# Patient Record
Sex: Male | Born: 1960 | Race: Black or African American | Hispanic: No | Marital: Single | Smoking: Never smoker
Health system: Southern US, Community
[De-identification: ages and names within clinical notes are randomized; demographics above are authoritative.]

## PROBLEM LIST (undated history)

## (undated) DIAGNOSIS — C9110 Chronic lymphocytic leukemia of B-cell type not having achieved remission: Secondary | ICD-10-CM

## (undated) DIAGNOSIS — I10 Essential (primary) hypertension: Secondary | ICD-10-CM

---

## 2011-07-03 ENCOUNTER — Emergency Department (HOSPITAL_COMMUNITY): Payer: BC Managed Care – PPO

## 2011-07-03 ENCOUNTER — Encounter: Payer: Self-pay | Admitting: *Deleted

## 2011-07-03 ENCOUNTER — Emergency Department (HOSPITAL_COMMUNITY)
Admission: EM | Admit: 2011-07-03 | Discharge: 2011-07-03 | Disposition: A | Discharge status: Home / Self Care | Payer: BC Managed Care – PPO | Attending: Emergency Medicine | Admitting: Emergency Medicine

## 2011-07-03 DIAGNOSIS — I498 Other specified cardiac arrhythmias: Secondary | ICD-10-CM | POA: Insufficient documentation

## 2011-07-03 DIAGNOSIS — R531 Weakness: Secondary | ICD-10-CM

## 2011-07-03 DIAGNOSIS — R5383 Other fatigue: Secondary | ICD-10-CM | POA: Insufficient documentation

## 2011-07-03 DIAGNOSIS — R5381 Other malaise: Secondary | ICD-10-CM | POA: Insufficient documentation

## 2011-07-03 HISTORY — DX: Essential (primary) hypertension: I10

## 2011-07-03 LAB — DIFFERENTIAL
Basophils Relative: 0 % (ref 0–1)
Eosinophils Relative: 1 % (ref 0–5)
Lymphs Abs: 13.9 10*3/uL — ABNORMAL HIGH (ref 0.7–4.0)
Monocytes Relative: 4 % (ref 3–12)
Neutro Abs: 10.6 10*3/uL — ABNORMAL HIGH (ref 1.7–7.7)

## 2011-07-03 LAB — COMPREHENSIVE METABOLIC PANEL
ALT: 9 U/L (ref 0–53)
Alkaline Phosphatase: 67 U/L (ref 39–117)
BUN: 17 mg/dL (ref 6–23)
CO2: 25 mEq/L (ref 19–32)
Chloride: 102 mEq/L (ref 96–112)
GFR calc Af Amer: >90 mL/min (ref >90)
Glucose, Bld: 156 mg/dL — ABNORMAL HIGH (ref 70–99)
Potassium: 3.6 mEq/L (ref 3.5–5.1)
Total Bilirubin: 0.3 mg/dL (ref 0.3–1.2)

## 2011-07-03 LAB — POCT I-STAT TROPONIN I: Troponin i, poc: 0.01 ng/mL (ref 0.00–0.08)

## 2011-07-03 LAB — CBC
HCT: 45.7 % (ref 39.0–52.0)
Hemoglobin: 15.3 g/dL (ref 13.0–17.0)
MCV: 93.8 fL (ref 78.0–100.0)
RBC: 4.87 MIL/uL (ref 4.22–5.81)
RDW: 14.7 % (ref 11.5–15.5)
WBC: 25.8 10*3/uL — ABNORMAL HIGH (ref 4.0–10.5)

## 2011-07-03 MED ORDER — SODIUM CHLORIDE 0.9 % IV BOLUS (SEPSIS)
1000.0000 mL | Freq: Once | INTRAVENOUS | Status: AC
  Administered 2011-07-03 (×2): 1000 mL via INTRAVENOUS

## 2011-07-03 MED ORDER — ASPIRIN 81 MG PO CHEW
324.0000 mg | CHEWABLE_TABLET | Freq: Once | ORAL | Status: AC
  Administered 2011-07-03: 324 mg via ORAL
  Filled 2011-07-03: qty 4

## 2011-07-03 NOTE — ED Notes (Signed)
Reports weakness and diaphoresis this am after waking up and getting to work. SB on monitor, 48-55. EKG shown to Dr. Alto Denver. Alert and oriented.

## 2011-07-03 NOTE — ED Notes (Signed)
Pt c/o weakness, diaphoresis, shaking at 0500 this morning. Pt had just urinated when sudden onset of s/s. Pt states diaphoresis resolved x 30 mins ago. Pt continues to c/o weakness and shakiness. Pt is bradycardiac in triage.

## 2011-07-03 NOTE — ED Provider Notes (Signed)
History     CSN: [id number]  Arrival date & time 07/03/11  0620   First MD Initiated Contact with Patient 07/03/11 [id number]      Chief Complaint  Patient presents with  . Weakness  . Shaking  . Excessive Sweating    (Consider location/radiation/quality/duration/timing/severity/associated sxs/prior treatment) HPI Patient is a 51 year old male who presents today complaining of generalized weakness. He noted this upon waking. He was worried that this could be a problem with his heart. He has no history of coronary artery disease and she denies any chest pain or shortness of breath. He endorses some shakiness as well. He was sick about 2 weeks ago with upper respiratory infection symptoms but these have resolved. He denies any cough or fevers. The patient denies any blood in his stools or dark tarry stools. He does report that in the past he has had low blood counts but he doesn't know what the reason for this was. He has family history of coronary artery disease. He denies any substance use and is not a smoker. He denies any neurologic symptoms at this time. There is nothing that has made this better or worse. He endorses only very mild generalized weakness at this time. Past Medical History  Diagnosis Date  . Hypertension     History reviewed. No pertinent past surgical history.  History reviewed. No pertinent family history.  History  Substance Use Topics  . Smoking status: Never Smoker   . Smokeless tobacco: Not on file  . Alcohol Use: No      Review of Systems  HENT: Negative.   Eyes: Negative.   Respiratory: Negative.   Cardiovascular: Negative.   Gastrointestinal: Negative.   Genitourinary: Negative.   Musculoskeletal: Negative.   Hematological: Negative.   Psychiatric/Behavioral: Negative.   All other systems reviewed and are negative.    Allergies  Review of patient's allergies indicates no known allergies.  Home Medications  No current outpatient prescriptions  on file.  BP 131/71  Pulse 50  Temp(Src) 98.6 F (37 C) (Oral)  Resp 20  SpO2 98%  Physical Exam  Nursing note and vitals reviewed. Constitutional: He is oriented to person, place, and time. He appears well-developed and well-nourished. No distress.  HENT:  Head: Normocephalic and atraumatic.  Eyes: Conjunctivae and EOM are normal. Pupils are equal, round, and reactive to light.  Neck: Normal range of motion.  Cardiovascular: Regular rhythm, normal heart sounds and intact distal pulses.  Bradycardia present.  Exam reveals no gallop and no friction rub.   No murmur heard. Pulmonary/Chest: Effort normal and breath sounds normal. No respiratory distress. He has no wheezes. He has no rales.  Abdominal: Soft. Bowel sounds are normal. He exhibits no distension. There is no tenderness. There is no rebound and no guarding.  Musculoskeletal: Normal range of motion. He exhibits no edema and no tenderness.  Neurological: He is alert and oriented to person, place, and time. No cranial nerve deficit. He exhibits normal muscle tone. Coordination normal.  Skin: Skin is warm and dry.  Psychiatric: He has a normal mood and affect.    ED Course  Procedures (including critical care time)  Labs Reviewed  CBC - Abnormal; Notable for the following:    WBC 25.8 (*)    All other components within normal limits  DIFFERENTIAL - Abnormal; Notable for the following:    Neutrophils Relative 41 (*)    Lymphocytes Relative 54 (*)    Neutro Abs 10.6 (*)    Lymphs  Abs 13.9 (*)    All other components within normal limits  COMPREHENSIVE METABOLIC PANEL - Abnormal; Notable for the following:    Glucose, Bld 156 (*)    All other components within normal limits  POCT I-STAT TROPONIN I  I-STAT TROPONIN I  PATHOLOGIST SMEAR REVIEW   Dg Chest 2 View  07/03/2011  *RADIOLOGY REPORT*  Clinical Data: Weakness.  History of hypertension.  CHEST - 2 VIEW  Comparison: None.  Findings: Cardiac and mediastinal contours  appear normal.  The lungs appear clear.  No pleural effusion is identified.  IMPRESSION:  No significant abnormality identified.  Original Report Authenticated By: Dellia Cloud, M.D.    Date: 07/03/2011  Rate: 48  Rhythm: sinus bradycardia  QRS Axis: normal  Intervals: normal  ST/T Wave abnormalities: nonspecific T wave changes  Conduction Disutrbances:none  Narrative Interpretation: T wave inversions in II,III, aVF, v5 and v6  Old EKG Reviewed: none available    No diagnosis found.    MDM  Patient is a 51 year old male who came in complaining of some generalized weakness. Patient had actually gone to work with this and then came here afterwards. Patient has never had any chest pain with this. He has no history of CAD or diabetes. He did not have significant risk factors for CAD. Patient did have workup for possible ACS contribution to his symptoms. He was given aspirin here. Patient stated that he felt much better than he had previously. Only remarkable finding was a mild bradycardia on initial EKG. Patient did have T-wave inversions noted in a few leads and there is no old EKG available for comparison. Laboratory workup was remarkable mainly for a leukocytosis. Patient reports that he has had trouble with having an elevated white blood cell count in the past. He's been seen by a hematologist and actually had a bone marrow biopsy. He is scheduled to followup with them in the future. He's been completely afebrile with no cough, no abdominal pain, no nausea, no vomiting, and no fevers. Further workup of this leukocytosis was not warranted today. I did place a call to the patient's primary care practice.  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  Patient will be monitored and have a three-hour troponin performed. If this returns normal he'll be discharged in good  condition.  11:12 AM Second troponin was negative. Patient was discharged home in good condition.        Cyndra Numbers, MD 07/03/11 1112

## 2013-09-07 ENCOUNTER — Emergency Department (HOSPITAL_COMMUNITY)
Admission: EM | Admit: 2013-09-07 | Discharge: 2013-09-07 | Disposition: A | Discharge status: Home / Self Care | Payer: Managed Care, Other (non HMO) | Attending: Emergency Medicine | Admitting: Emergency Medicine

## 2013-09-07 ENCOUNTER — Encounter (HOSPITAL_COMMUNITY): Payer: Self-pay | Admitting: Emergency Medicine

## 2013-09-07 DIAGNOSIS — L02818 Cutaneous abscess of other sites: Secondary | ICD-10-CM | POA: Insufficient documentation

## 2013-09-07 DIAGNOSIS — M542 Cervicalgia: Secondary | ICD-10-CM | POA: Insufficient documentation

## 2013-09-07 DIAGNOSIS — Z79899 Other long term (current) drug therapy: Secondary | ICD-10-CM | POA: Insufficient documentation

## 2013-09-07 DIAGNOSIS — Z792 Long term (current) use of antibiotics: Secondary | ICD-10-CM | POA: Insufficient documentation

## 2013-09-07 DIAGNOSIS — L03818 Cellulitis of other sites: Principal | ICD-10-CM

## 2013-09-07 DIAGNOSIS — L0390 Cellulitis, unspecified: Secondary | ICD-10-CM

## 2013-09-07 DIAGNOSIS — IMO0002 Reserved for concepts with insufficient information to code with codable children: Secondary | ICD-10-CM | POA: Insufficient documentation

## 2013-09-07 DIAGNOSIS — I10 Essential (primary) hypertension: Secondary | ICD-10-CM | POA: Insufficient documentation

## 2013-09-07 LAB — CBC WITH DIFFERENTIAL/PLATELET
BASOS ABS: 0.1 10*3/uL (ref 0.0–0.1)
Basophils Relative: 1 % (ref 0–1)
EOS ABS: 0.1 10*3/uL (ref 0.0–0.7)
EOS PCT: 1 % (ref 0–5)
HCT: 47.5 % (ref 39.0–52.0)
Hemoglobin: 16 g/dL (ref 13.0–17.0)
LYMPHS PCT: 51 % — AB (ref 12–46)
Lymphs Abs: 5.9 10*3/uL — ABNORMAL HIGH (ref 0.7–4.0)
MCH: 31 pg (ref 26.0–34.0)
MCHC: 33.7 g/dL (ref 30.0–36.0)
MCV: 92.1 fL (ref 78.0–100.0)
Monocytes Absolute: 0.6 10*3/uL (ref 0.1–1.0)
Monocytes Relative: 5 % (ref 3–12)
NEUTROS PCT: 43 % (ref 43–77)
Neutro Abs: 5 10*3/uL (ref 1.7–7.7)
PLATELETS: 161 10*3/uL (ref 150–400)
RBC: 5.16 MIL/uL (ref 4.22–5.81)
RDW: 14.7 % (ref 11.5–15.5)
WBC: 11.6 10*3/uL — AB (ref 4.0–10.5)

## 2013-09-07 LAB — COMPREHENSIVE METABOLIC PANEL
ALT: 9 U/L (ref 0–53)
AST: 18 U/L (ref 0–37)
Albumin: 3.7 g/dL (ref 3.5–5.2)
Alkaline Phosphatase: 70 U/L (ref 39–117)
BUN: 17 mg/dL (ref 6–23)
CALCIUM: 9.4 mg/dL (ref 8.4–10.5)
CO2: 26 meq/L (ref 19–32)
Chloride: 100 mEq/L (ref 96–112)
Creatinine, Ser: 0.98 mg/dL (ref 0.50–1.35)
GFR calc Af Amer: >90 mL/min (ref >90)
GFR calc non Af Amer: >90 mL/min (ref >90)
Glucose, Bld: 127 mg/dL — ABNORMAL HIGH (ref 70–99)
Potassium: 3.6 mEq/L — ABNORMAL LOW (ref 3.7–5.3)
SODIUM: 139 meq/L (ref 137–147)
TOTAL PROTEIN: 7.2 g/dL (ref 6.0–8.3)
Total Bilirubin: 0.8 mg/dL (ref 0.3–1.2)

## 2013-09-07 MED ORDER — HYDROMORPHONE HCL PF 1 MG/ML IJ SOLN
0.5000 mg | Freq: Once | INTRAMUSCULAR | Status: AC
  Administered 2013-09-07: 0.5 mg via INTRAVENOUS
  Filled 2013-09-07: qty 1

## 2013-09-07 MED ORDER — CLINDAMYCIN PHOSPHATE 900 MG/50ML IV SOLN
900.0000 mg | Freq: Once | INTRAVENOUS | Status: AC
  Administered 2013-09-07: 900 mg via INTRAVENOUS
  Filled 2013-09-07: qty 50

## 2013-09-07 MED ORDER — SODIUM CHLORIDE 0.9 % IV BOLUS (SEPSIS)
1000.0000 mL | Freq: Once | INTRAVENOUS | Status: AC
  Administered 2013-09-07: 1000 mL via INTRAVENOUS

## 2013-09-07 MED ORDER — CLINDAMYCIN HCL 150 MG PO CAPS: 300.0000 mg | ORAL_CAPSULE | Freq: Three times a day (TID) | ORAL | Status: DC

## 2013-09-07 MED ORDER — HYDROCODONE-ACETAMINOPHEN 5-325 MG PO TABS: 1.0000 | ORAL_TABLET | Freq: Four times a day (QID) | ORAL | Status: DC | PRN

## 2013-09-07 NOTE — ED Notes (Addendum)
Pt was seen for cellulitis on the left side of his head at his family doctor. Family states the area has gotten bigger and there is a rash on his forehead. Pt c/o continued headache.

## 2013-09-07 NOTE — ED Provider Notes (Signed)
CSN: [id number]     Arrival date & time 09/07/13  1924 History  This chart was scribed for Carmin Muskrat, MD by Jenne Campus, ED Scribe. This patient was seen in room APA11/APA11 and the patient's care was started at 8:00 PM.   Chief Complaint  Patient presents with  . Headache     The history is provided by the patient. No language interpreter was used.    HPI Comments: Thomas Bernard is a 53 y.o. male who presents to the Emergency Department complaining of worsening of a skin infection to the left parietal/occipital region over the past 2 days with associated increased pain and swelling. Pt states that he noted a small bump to the side of his head with mild neck pain. Pt shaves his head and thought it was an ingrown hair. He was seen by his PCP when sxs did not improve and dx with cellulitis 6 days ago. He was started on Doxycycline and Tamadol. He states that he has taken the antibiotics without any missed doses with no improvement. Over the past 2 days the cellulitis has gradually spread to his posterior ear and further outward with a new development of associated posterior neck pain and HAs. He states that the Tramadol caused him to feel nauseated and he has since stopped taking the medication. He has been taking Aleve and BC powders in its place with no improvement. He denies any difficulty swallowing or SOB. He has a h/o HTN but denies any other medical problems.   PCP is with [facility]  Past Medical History  Diagnosis Date  . Hypertension    History reviewed. No pertinent past surgical history. History reviewed. No pertinent family history. History  Substance Use Topics  . Smoking status: Never Smoker   . Smokeless tobacco: Not on file  . Alcohol Use: No    Review of Systems  Constitutional:       Per HPI, otherwise negative  HENT:       Per HPI, otherwise negative  Respiratory:       Per HPI, otherwise negative  Cardiovascular:       Per HPI, otherwise negative   Gastrointestinal: Negative for vomiting.  Endocrine:       Negative aside from HPI  Genitourinary:       Neg aside from HPI   Musculoskeletal:       Per HPI, otherwise negative  Skin: Positive for rash.  Neurological: Positive for headaches. Negative for syncope.    A complete 10 system review of systems was obtained and all systems are negative except as noted in the HPI and PMH.    Allergies  Review of patient's allergies indicates no known allergies.  Home Medications   Current Outpatient Rx  Name  Route  Sig  Dispense  Refill  . doxycycline (ADOXA) 100 MG tablet   Oral   Take 100 mg by mouth 2 (two) times daily.         . fluticasone (FLONASE) 50 MCG/ACT nasal spray   Nasal   Place 2 sprays into the nose daily.           Marland Kitchen lisinopril (PRINIVIL,ZESTRIL) 20 MG tablet   Oral   Take 20 mg by mouth daily.           . mupirocin ointment (BACTROBAN) 2 %   Topical   Apply 1 application topically daily.         . traMADol (ULTRAM) 50 MG tablet   Oral  Take 25 mg by mouth every 6 (six) hours as needed. pain          Triage Vitals: BP 145/80  Pulse 104  Temp(Src) 98.3 F (36.8 C) (Oral)  Resp 20  Ht 6' (1.829 m)  Wt 200 lb (90.719 kg)  BMI 27.12 kg/m2  SpO2 98%  Physical Exam  Nursing note and vitals reviewed. Constitutional: He is oriented to person, place, and time. He appears well-developed and well-nourished. No distress.  HENT:  Left parietal/occipital area swelling with pustules and honey crusting. Left TM is normal  Eyes: Conjunctivae and EOM are normal.  Cardiovascular: Normal rate and regular rhythm.   Pulmonary/Chest: Effort normal and breath sounds normal. No stridor. No respiratory distress.  Abdominal: He exhibits no distension.  Musculoskeletal: Normal range of motion. He exhibits no edema.  Neurological: He is alert and oriented to person, place, and time.  Skin: Skin is warm and dry.  Psychiatric: He has a normal mood and affect.     ED Course  Procedures (including critical care time)  Medications  sodium chloride 0.9 % bolus 1,000 mL (1,000 mLs Intravenous New Bag/Given 09/07/13 2047)  HYDROmorphone (DILAUDID) injection 0.5 mg (0.5 mg Intravenous Given 09/07/13 2048)  clindamycin (CLEOCIN) IVPB 900 mg (900 mg Intravenous New Bag/Given 09/07/13 2047)    DIAGNOSTIC STUDIES: Oxygen Saturation is 98% on RA, normal by my interpretation.    COORDINATION OF CARE: 8:08 PM-Discussed treatment plan which includes IV fluids/antibiotics, pain medications and CBC with pt at bedside and pt agreed to plan.   10:01 PM-Pt rechecked and feels improved with medications listed above. Informed pt of results. Discussed discharge plan which includes PO Cleocin, stopping doxycycline and pain medication with pt and pt agreed to plan. Also advised pt to follow up with PCP next week and pt agreed. Addressed symptoms to return for with pt.   Labs Review Labs Reviewed  CBC WITH DIFFERENTIAL - Abnormal; Notable for the following:    WBC 11.6 (*)    Lymphocytes Relative 51 (*)    Lymphs Abs 5.9 (*)    All other components within normal limits  COMPREHENSIVE METABOLIC PANEL - Abnormal; Notable for the following:    Potassium 3.6 (*)    Glucose, Bld 127 (*)    All other components within normal limits    MDM   Final diagnoses:  Cellulitis    I personally performed the services described in this documentation, which was scribed in my presence. The recorded information has been reviewed and is accurate.  Patient presents with cutaneous scalp wounds concerning for cellulitis, likely due to folliculitis barbering. Patient is afebrile, aside from mild tachycardia, and in no distress, hemodynamically stable, neurologically intact.  Patient did not have improvement with prior oral antibiotics, was switched to clindamycin with initiation of IV therapy first.  In with this and analgesics he had a solution of his complaints.  With no neurologic  concerns, no hemodynamically instability, minimal risk profile for systemic infection, he is appropriate for trial of outpatient management.  He and his wife was understanding of return precautions, follow up instructions, and the patient will follow up with his physician in 2 days for wound check.   Carmin Muskrat, MD 09/07/13 [phone]

## 2013-09-07 NOTE — Discharge Instructions (Signed)
As discussed, we are initiating therapy with a new antibiotic for your skin infection.  It is very important that you follow-up with your physician in two days for a wound check.  If you develop any of the concerning changes in your condition that we discussed, please be sure to return here immediately.

## 2013-09-07 NOTE — ED Notes (Signed)
Family at bedside. Patient returning from restroom. Vitals taken. WDL. Patient wanting to know if discharge is ready.

## 2013-09-09 ENCOUNTER — Inpatient Hospital Stay (HOSPITAL_COMMUNITY)
Admission: EM | Admit: 2013-09-09 | Discharge: 2013-09-13 | DRG: 603 | Disposition: A | Discharge status: Home / Self Care | Payer: Managed Care, Other (non HMO) | Attending: Family Medicine | Admitting: Family Medicine

## 2013-09-09 ENCOUNTER — Encounter (HOSPITAL_COMMUNITY): Payer: Self-pay | Admitting: Emergency Medicine

## 2013-09-09 DIAGNOSIS — D72820 Lymphocytosis (symptomatic): Secondary | ICD-10-CM

## 2013-09-09 DIAGNOSIS — L0390 Cellulitis, unspecified: Secondary | ICD-10-CM | POA: Diagnosis present

## 2013-09-09 DIAGNOSIS — L03818 Cellulitis of other sites: Principal | ICD-10-CM

## 2013-09-09 DIAGNOSIS — L03811 Cellulitis of head [any part, except face]: Secondary | ICD-10-CM | POA: Diagnosis present

## 2013-09-09 DIAGNOSIS — L080 Pyoderma: Secondary | ICD-10-CM

## 2013-09-09 DIAGNOSIS — R21 Rash and other nonspecific skin eruption: Secondary | ICD-10-CM

## 2013-09-09 DIAGNOSIS — C9110 Chronic lymphocytic leukemia of B-cell type not having achieved remission: Secondary | ICD-10-CM

## 2013-09-09 DIAGNOSIS — I10 Essential (primary) hypertension: Secondary | ICD-10-CM | POA: Diagnosis present

## 2013-09-09 DIAGNOSIS — L02818 Cutaneous abscess of other sites: Principal | ICD-10-CM | POA: Diagnosis present

## 2013-09-09 DIAGNOSIS — L089 Local infection of the skin and subcutaneous tissue, unspecified: Secondary | ICD-10-CM | POA: Diagnosis present

## 2013-09-09 HISTORY — DX: Chronic lymphocytic leukemia of B-cell type not having achieved remission: C91.10

## 2013-09-09 LAB — CBC WITH DIFFERENTIAL/PLATELET
BASOS PCT: 1 % (ref 0–1)
Basophils Absolute: 0.1 10*3/uL (ref 0.0–0.1)
EOS ABS: 0 10*3/uL (ref 0.0–0.7)
Eosinophils Relative: 0 % (ref 0–5)
HEMATOCRIT: 44.3 % (ref 39.0–52.0)
HEMOGLOBIN: 14.9 g/dL (ref 13.0–17.0)
Lymphocytes Relative: 57 % — ABNORMAL HIGH (ref 12–46)
Lymphs Abs: 7.2 10*3/uL — ABNORMAL HIGH (ref 0.7–4.0)
MCH: 30.7 pg (ref 26.0–34.0)
MCHC: 33.6 g/dL (ref 30.0–36.0)
MCV: 91.3 fL (ref 78.0–100.0)
MONO ABS: 0.9 10*3/uL (ref 0.1–1.0)
Monocytes Relative: 7 % (ref 3–12)
NEUTROS ABS: 4.4 10*3/uL (ref 1.7–7.7)
Neutrophils Relative %: 35 % — ABNORMAL LOW (ref 43–77)
Platelets: 156 10*3/uL (ref 150–400)
RBC: 4.85 MIL/uL (ref 4.22–5.81)
RDW: 14.3 % (ref 11.5–15.5)
WBC: 12.6 10*3/uL — ABNORMAL HIGH (ref 4.0–10.5)

## 2013-09-09 LAB — HEPATIC FUNCTION PANEL
ALT: 8 U/L (ref 0–53)
AST: 16 U/L (ref 0–37)
Albumin: 3.7 g/dL (ref 3.5–5.2)
Alkaline Phosphatase: 71 U/L (ref 39–117)
Total Bilirubin: 0.8 mg/dL (ref 0.3–1.2)
Total Protein: 7.5 g/dL (ref 6.0–8.3)

## 2013-09-09 LAB — BASIC METABOLIC PANEL
BUN: 17 mg/dL (ref 6–23)
CALCIUM: 9.1 mg/dL (ref 8.4–10.5)
CO2: 25 mEq/L (ref 19–32)
Chloride: 101 mEq/L (ref 96–112)
Creatinine, Ser: 0.85 mg/dL (ref 0.50–1.35)
GFR calc Af Amer: >90 mL/min (ref >90)
GLUCOSE: 137 mg/dL — AB (ref 70–99)
Potassium: 3.8 mEq/L (ref 3.7–5.3)
Sodium: 138 mEq/L (ref 137–147)

## 2013-09-09 LAB — PATHOLOGIST SMEAR REVIEW

## 2013-09-09 LAB — SEDIMENTATION RATE: Sed Rate: 17 mm/hr — ABNORMAL HIGH (ref 0–16)

## 2013-09-09 MED ORDER — ONDANSETRON HCL 4 MG/2ML IJ SOLN
4.0000 mg | Freq: Once | INTRAMUSCULAR | Status: AC
  Administered 2013-09-09: 4 mg via INTRAVENOUS
  Filled 2013-09-09: qty 2

## 2013-09-09 MED ORDER — VANCOMYCIN HCL IN DEXTROSE 1-5 GM/200ML-% IV SOLN
1000.0000 mg | Freq: Once | INTRAVENOUS | Status: AC
  Administered 2013-09-09: 1000 mg via INTRAVENOUS
  Filled 2013-09-09: qty 200

## 2013-09-09 MED ORDER — SODIUM CHLORIDE 0.9 % IV SOLN
1000.0000 mL | INTRAVENOUS | Status: DC
Start: 2013-09-09 — End: 2013-09-09
  Administered 2013-09-09: 1000 mL via INTRAVENOUS

## 2013-09-09 MED ORDER — DIPHENHYDRAMINE HCL 50 MG/ML IJ SOLN
25.0000 mg | Freq: Once | INTRAMUSCULAR | Status: AC
  Administered 2013-09-09: 25 mg via INTRAVENOUS
  Filled 2013-09-09: qty 1

## 2013-09-09 MED ORDER — VANCOMYCIN HCL 10 G IV SOLR
1500.0000 mg | Freq: Once | INTRAVENOUS | Status: DC
  Filled 2013-09-09: qty 1500

## 2013-09-09 MED ORDER — VANCOMYCIN HCL IN DEXTROSE 1-5 GM/200ML-% IV SOLN
1000.0000 mg | Freq: Three times a day (TID) | INTRAVENOUS | Status: DC
  Administered 2013-09-09 – 2013-09-13 (×12): 1000 mg via INTRAVENOUS
  Filled 2013-09-09 (×18): qty 200

## 2013-09-09 MED ORDER — MUPIROCIN 2 % EX OINT
1.0000 "application " | TOPICAL_OINTMENT | Freq: Every day | CUTANEOUS | Status: DC
  Administered 2013-09-10 – 2013-09-13 (×5): 1 via TOPICAL
  Filled 2013-09-09: qty 22

## 2013-09-09 MED ORDER — SODIUM CHLORIDE 0.9 % IV SOLN
INTRAVENOUS | Status: DC
  Administered 2013-09-09: 12:00:00 via INTRAVENOUS

## 2013-09-09 MED ORDER — VANCOMYCIN HCL IN DEXTROSE 1-5 GM/200ML-% IV SOLN
1000.0000 mg | Freq: Once | INTRAVENOUS | Status: DC
  Filled 2013-09-09: qty 200

## 2013-09-09 MED ORDER — DIPHENHYDRAMINE HCL 25 MG PO CAPS: 25.0000 mg | ORAL_CAPSULE | ORAL | Status: DC | PRN

## 2013-09-09 MED ORDER — ENOXAPARIN SODIUM 40 MG/0.4ML ~~LOC~~ SOLN
40.0000 mg | SUBCUTANEOUS | Status: DC
  Administered 2013-09-09 – 2013-09-13 (×5): 40 mg via SUBCUTANEOUS
  Filled 2013-09-09 (×5): qty 0.4

## 2013-09-09 MED ORDER — LISINOPRIL 10 MG PO TABS
20.0000 mg | ORAL_TABLET | Freq: Every day | ORAL | Status: DC
  Administered 2013-09-09 – 2013-09-13 (×5): 20 mg via ORAL
  Filled 2013-09-09 (×5): qty 2

## 2013-09-09 MED ORDER — ACYCLOVIR 200 MG PO CAPS
ORAL_CAPSULE | ORAL | Status: AC
  Filled 2013-09-09: qty 1

## 2013-09-09 MED ORDER — VANCOMYCIN HCL IN DEXTROSE 1-5 GM/200ML-% IV SOLN
1000.0000 mg | INTRAVENOUS | Status: AC
  Administered 2013-09-09: 1000 mg via INTRAVENOUS
  Filled 2013-09-09: qty 200

## 2013-09-09 MED ORDER — MORPHINE SULFATE 4 MG/ML IJ SOLN
4.0000 mg | Freq: Once | INTRAMUSCULAR | Status: AC
  Administered 2013-09-09: 4 mg via INTRAVENOUS
  Filled 2013-09-09: qty 1

## 2013-09-09 MED ORDER — ACYCLOVIR 200 MG PO CAPS
200.0000 mg | ORAL_CAPSULE | Freq: Every day | ORAL | Status: DC
  Administered 2013-09-09 – 2013-09-13 (×22): 200 mg via ORAL
  Filled 2013-09-09 (×30): qty 1

## 2013-09-09 MED ORDER — ONDANSETRON HCL 4 MG PO TABS
4.0000 mg | ORAL_TABLET | Freq: Four times a day (QID) | ORAL | Status: DC | PRN
  Filled 2013-09-09: qty 1

## 2013-09-09 MED ORDER — ONDANSETRON HCL 4 MG/2ML IJ SOLN
4.0000 mg | Freq: Four times a day (QID) | INTRAMUSCULAR | Status: DC | PRN
  Administered 2013-09-09: 4 mg via INTRAVENOUS
  Filled 2013-09-09: qty 2

## 2013-09-09 MED ORDER — OXYCODONE-ACETAMINOPHEN 5-325 MG PO TABS
1.0000 | ORAL_TABLET | ORAL | Status: DC | PRN
  Administered 2013-09-09 – 2013-09-13 (×7): 1 via ORAL
  Filled 2013-09-09 (×8): qty 1

## 2013-09-09 MED ORDER — MORPHINE SULFATE 2 MG/ML IJ SOLN
2.0000 mg | INTRAMUSCULAR | Status: DC | PRN
  Administered 2013-09-09 (×2): 2 mg via INTRAVENOUS
  Filled 2013-09-09 (×2): qty 1

## 2013-09-09 MED ORDER — SODIUM CHLORIDE 0.9 % IV SOLN
1000.0000 mL | Freq: Once | INTRAVENOUS | Status: AC
  Administered 2013-09-09: 1000 mL via INTRAVENOUS

## 2013-09-09 NOTE — ED Notes (Signed)
Pt c/o cellullitis to the scalp, fever and emesis. Pt states he needs to be admitted to hospital.

## 2013-09-09 NOTE — Progress Notes (Addendum)
Pt is 53 yo male with probable scalp cellulitis.   Start Vancomycin  due to inability to tolerate po.  Wt = 90 kg and CrCl estimated at 109 ml/min.   Got Vancomycin 1000 mg IV x 1 in ER.  AP RPh will dose in am.  Joetta Manners, Pharm.D.

## 2013-09-09 NOTE — ED Provider Notes (Signed)
CSN: [id number]     Arrival date & time 09/09/13  0106 History   First MD Initiated Contact with Patient 09/09/13 0128     Chief Complaint  Patient presents with  . Recurrent Skin Infections     (Consider location/radiation/quality/duration/timing/severity/associated sxs/prior Treatment) The history is provided by the patient.   54 year old male who has been treated for cellulitis for the last week. He started having some bumps that came up on the left side of his scalp which he thought was secondary to shaving. He saw his PCP started him on doxycycline but he did not improve. He came to the ED yesterday and was switched to clindamycin. Today, he started vomiting and has not been able to hold anything down during the course of the day. Also, the rash has spread beyond the scalp to the face and trunk and arms. He has had subjective fever but no chills or sweats. The lesions are both painful and itchy. He rates pain at 7/10. He denies any sick contacts and he states that he had chickenpox as a child.   Past Medical History  Diagnosis Date  . Hypertension    History reviewed. No pertinent past surgical history. History reviewed. No pertinent family history. History  Substance Use Topics  . Smoking status: Never Smoker   . Smokeless tobacco: Not on file  . Alcohol Use: No    Review of Systems  All other systems reviewed and are negative.      Allergies  Review of patient's allergies indicates no known allergies.  Home Medications   Current Outpatient Rx  Name  Route  Sig  Dispense  Refill  . clindamycin (CLEOCIN) 150 MG capsule   Oral   Take 2 capsules (300 mg total) by mouth 3 (three) times daily.   42 capsule   0   . fluticasone (FLONASE) 50 MCG/ACT nasal spray   Nasal   Place 2 sprays into the nose daily.           Marland Kitchen HYDROcodone-acetaminophen (NORCO/VICODIN) 5-325 MG per tablet   Oral   Take 1 tablet by mouth every 6 (six) hours as needed.   15 tablet   0   .  lisinopril (PRINIVIL,ZESTRIL) 20 MG tablet   Oral   Take 20 mg by mouth daily.           . mupirocin ointment (BACTROBAN) 2 %   Topical   Apply 1 application topically daily.         . traMADol (ULTRAM) 50 MG tablet   Oral   Take 25 mg by mouth every 6 (six) hours as needed. pain          BP 143/93  Pulse 90  Temp(Src) 97.7 F (36.5 C) (Oral)  Resp 18  Ht 6' (1.829 m)  Wt 200 lb (90.719 kg)  BMI 27.12 kg/m2  SpO2 94% Physical Exam  Nursing note and vitals reviewed.  53 year old male, resting comfortably and in no acute distress. Vital signs are significant for borderline hypertension with blood pressure 143/93. Oxygen saturation is 94%, which is normal. Head is normocephalic and atraumatic. PERRLA, EOMI. Oropharynx is clear. There is an area of erythema and thickening of the scalp in the left parietal area. Some of the erythema extends to the left ear. Scattered pustules are present on the face and on the scalp away from the erythematous area. Neck is nontender and supple without adenopathy or JVD. Back is nontender and there is no CVA  tenderness. Lungs are clear without rales, wheezes, or rhonchi. Chest is nontender. Heart has regular rate and rhythm without murmur. Abdomen is soft, flat, nontender without masses or hepatosplenomegaly and peristalsis is normoactive. Extremities have no cyanosis or edema, full range of motion is present. Skin has scalp and facial rash as noted above. Pustules are also present on the trunk and upper arms. Appearance would be consistent with varicella. Neurologic: Mental status is normal, cranial nerves are intact, there are no motor or sensory deficits.  ED Course  Procedures (including critical care time) Labs Review Results for orders placed during the hospital encounter of 09/09/13  CBC WITH DIFFERENTIAL      Result Value Ref Range   WBC 12.6 (*) 4.0 - 10.5 K/uL   RBC 4.85  4.22 - 5.81 MIL/uL   Hemoglobin 14.9  13.0 - 17.0 g/dL    HCT 44.3  39.0 - 52.0 %   MCV 91.3  78.0 - 100.0 fL   MCH 30.7  26.0 - 34.0 pg   MCHC 33.6  30.0 - 36.0 g/dL   RDW 14.3  11.5 - 15.5 %   Platelets 156  150 - 400 K/uL   Neutrophils Relative % 35 (*) 43 - 77 %   Lymphocytes Relative 57 (*) 12 - 46 %   Monocytes Relative 7  3 - 12 %   Eosinophils Relative 0  0 - 5 %   Basophils Relative 1  0 - 1 %   Neutro Abs 4.4  1.7 - 7.7 K/uL   Lymphs Abs 7.2 (*) 0.7 - 4.0 K/uL   Monocytes Absolute 0.9  0.1 - 1.0 K/uL   Eosinophils Absolute 0.0  0.0 - 0.7 K/uL   Basophils Absolute 0.1  0.0 - 0.1 K/uL   WBC Morphology ATYPICAL LYMPHOCYTES    BASIC METABOLIC PANEL      Result Value Ref Range   Sodium 138  137 - 147 mEq/L   Potassium 3.8  3.7 - 5.3 mEq/L   Chloride 101  96 - 112 mEq/L   CO2 25  19 - 32 mEq/L   Glucose, Bld 137 (*) 70 - 99 mg/dL   BUN 17  6 - 23 mg/dL   Creatinine, Ser 0.85  0.50 - 1.35 mg/dL   Calcium 9.1  8.4 - 10.5 mg/dL   GFR calc non Af Amer >90  >90 mL/min   GFR calc Af Amer >90  >90 mL/min  SEDIMENTATION RATE      Result Value Ref Range   Sed Rate 17 (*) 0 - 16 mm/hr  HEPATIC FUNCTION PANEL      Result Value Ref Range   Total Protein 7.5  6.0 - 8.3 g/dL   Albumin 3.7  3.5 - 5.2 g/dL   AST 16  0 - 37 U/L   ALT 8  0 - 53 U/L   Alkaline Phosphatase 71  39 - 117 U/L   Total Bilirubin 0.8  0.3 - 1.2 mg/dL   Bilirubin, Direct <0.2  0.0 - 0.3 mg/dL   Indirect Bilirubin NOT CALCULATED  0.3 - 0.9 mg/dL    MDM   Final diagnoses:  Cellulitis of scalp  Pustular rash    Pustular rash with area of cellulitis in the scalp which has failed to respond to appropriate outpatient antibiotics. Rash is consistent with varicella but patient states he had varicella as a child and which would make it very unlikely that he has varicella now. He has not had any change  in medication other than the antibiotics. He does take lisinopril for hypertension. Old records are reviewed and confirm his account of the ED visit on March 15. He is  given a dose of vancomycin in the ED and plan is for admission for treatment of cellulitis that has failed outpatient management, and further evaluation of pustular rash.  Laboratory workup is remarkable for leukocytosis with right shift and atypical lymphocytes present. Sedimentation rate is minimally elevated at 17. Case is discussed with Dr. Darrick Meigs of [facility] who agrees to admit the patient.  Delora Fuel, MD 79/48/01 [id number]

## 2013-09-09 NOTE — Progress Notes (Signed)
ANTIBIOTIC CONSULT NOTE - INITIAL  Pharmacy Consult for Vancomycin Indication: cellulitis  No Known Allergies  Patient Measurements: Height: 6' (182.9 cm) Weight: 200 lb (90.719 kg) IBW/kg (Calculated) : 77.6  Vital Signs: Temp: 97.7 F (36.5 C) (03/17 0112) Temp src: Oral (03/17 0112) BP: 138/82 mmHg (03/17 0433) Pulse Rate: 52 (03/17 0433) Intake/Output from previous day: 03/16 0701 - 03/17 0700 In: 1000 [I.V.:1000] Out: -  Intake/Output from this shift:    Labs:  Recent Labs  09/07/13 2044 09/09/13 0130  WBC 11.6* 12.6*  HGB 16.0 14.9  PLT 161 156  CREATININE 0.98 0.85   Estimated Creatinine Clearance: 109 ml/min (by C-G formula based on Cr of 0.85). No results found for this basename: VANCOTROUGH, VANCOPEAK, VANCORANDOM, GENTTROUGH, GENTPEAK, GENTRANDOM, TOBRATROUGH, TOBRAPEAK, TOBRARND, AMIKACINPEAK, AMIKACINTROU, AMIKACIN,  in the last 72 hours   Microbiology: No results found for this or any previous visit (from the past 720 hour(s)).  Medical History: Past Medical History  Diagnosis Date  . Hypertension     Medications:  Scheduled:  . acyclovir  200 mg Oral 5 X Daily  . enoxaparin (LOVENOX) injection  40 mg Subcutaneous Q24H  . lisinopril  20 mg Oral Daily   Assessment: 53 yo M with skin infection of the scalp which started ~ 1 week ago.  He was started on oral doxycycline, however rash has continued to spread.  This was switched to clindamycin which caused him nausea.  He is being admitted for IV Vancomycin. He has also been started on acyclovir due to possible varicella eruption.  Dermatology consult pending.  He is currently afebrile & hemodynamically stable with elevated WBC.  Renal function is at patient's baseline.   Vancomycin 3/17>> Acyclovir 3/17>>  Goal of Therapy:  Vancomycin trough level 10-15 mcg/ml  Plan:  Vancomycin 1gm IV q8h Check Vancomycin trough at steady state Monitor renal function    Biagio Borg 09/09/2013,7:50 AM

## 2013-09-09 NOTE — Progress Notes (Signed)
Patient admitted earlier this morning by Dr. Darrick Meigs.  Patient seen and examined.   Patient has been admitted with progressive rash.  He he reports that rash started on left part of scalp and has gradually progressed down his neck, to his back, chest abdomen and now down his legs.  He has pustular lesions which are not painful and do not itch. He has been treated with two courses of antibiotics, including doxycycline and bactrim. The patient has already had chicken pox as a child.  He does not report any fevers. He does not appear septic or toxic. ESR is mildly elevated at 17 (normal range is until 16). Rash is bilateral, crosses midline which makes shingles unlikely. He has been started on vancomycin for any component of cellulitis.  He does not report any insect bites, new medications, changes in shampoo or environmental factors. The exact etiology of his rash is not clear. Will continue to monitor. Check RPR.  If patient does not appear to be clinically worsening, then can consider discharge with close follow up with dermatology.  Thomas Bernard

## 2013-09-09 NOTE — Progress Notes (Signed)
UR chart review completed.  

## 2013-09-09 NOTE — H&P (Signed)
PCP:   No primary provider on file.   Chief Complaint:  Recurrent skin infection  HPI: 53 year old male who   has a past medical history of Hypertension. today presented to the ED with worsening skin infection of the scalp, patient says that he noticed bumps on the left side of the scalp a week ago, and he saw his PCP who put him on doxycycline , but when he did not improve he came to the ED yesterday and was switched to clindamycin. Patient was vomiting today so he was not able to hold his medications down 3 came to the ED. Denies any fever, the rash has spread be on the scalp the face trunk arms and legs. He denies any sick contacts at home, and says that he had chickenpox as a child. He says the rash is painful, pain is 7 / 10 in intensity. He denies chest pain no shortness of breath.   Allergies:  No Known Allergies    Past Medical History  Diagnosis Date  . Hypertension     History reviewed. No pertinent past surgical history.  Prior to Admission medications   Medication Sig Start Date End Date Taking? Authorizing Provider  clindamycin (CLEOCIN) 150 MG capsule Take 2 capsules (300 mg total) by mouth 3 (three) times daily. 09/07/13  Yes Carmin Muskrat, MD  fluticasone [facility]) 50 MCG/ACT nasal spray Place 2 sprays into the nose daily.     Yes Historical Provider, MD  HYDROcodone-acetaminophen (NORCO/VICODIN) 5-325 MG per tablet Take 1 tablet by mouth every 6 (six) hours as needed. 09/07/13  Yes Carmin Muskrat, MD  lisinopril (PRINIVIL,ZESTRIL) 20 MG tablet Take 20 mg by mouth daily.     Yes Historical Provider, MD  mupirocin ointment (BACTROBAN) 2 % Apply 1 application topically daily. 09/02/13  Yes Historical Provider, MD  traMADol (ULTRAM) 50 MG tablet Take 25 mg by mouth every 6 (six) hours as needed. pain 09/02/13   Historical Provider, MD    Social History:  reports that he has never smoked. He does not have any smokeless tobacco history on file. He reports that he does not  drink alcohol or use illicit drugs.    All the positives are listed in BOLD  Review of Systems:  HEENT: Headache, blurred vision, runny nose, sore throat Neck: Hypothyroidism, hyperthyroidism,,lymphadenopathy Chest : Shortness of breath, history of COPD, Asthma Heart : Chest pain, history of coronary arterey disease GI:  Nausea, vomiting, diarrhea, constipation, GERD GU: Dysuria, urgency, frequency of urination, hematuria Neuro: Stroke, seizures, syncope Psych: Depression, anxiety, hallucinations   Physical Exam: Blood pressure 143/93, pulse 90, temperature 97.7 F (36.5 C), temperature source Oral, resp. rate 18, height 6' (1.829 m), weight 90.719 kg (200 lb), SpO2 94.00%. Constitutional:   Patient is a well-developed and well-nourished male* in no acute distress and cooperative with exam. Head: Normocephalic and atraumatic Mouth: Mucus membranes moist Eyes: PERRL, EOMI, conjunctivae normal Neck: Supple, No Thyromegaly Cardiovascular: RRR, S1 normal, S2 normal Pulmonary/Chest: CTAB, no wheezes, rales, or rhonchi Abdominal: Soft. Non-tender, non-distended, bowel sounds are normal, no masses, organomegaly, or guarding present.  Neurological: A&O x3, Strenght is normal and symmetric bilaterally, cranial nerve II-XII are grossly intact, no focal motor deficit, sensory intact to light touch bilaterally.  Extremities : No Cyanosis, Clubbing or Edema Skin: Pustular, erythematous rash noticed on the left side of scalp along with trunk back neck legs.    Labs on Admission:  Results for orders placed during the hospital encounter of 09/09/13 (from the  past 48 hour(s))  CBC WITH DIFFERENTIAL     Status: Abnormal   Collection Time    09/09/13  1:30 AM      Result Value Ref Range   WBC 12.6 (*) 4.0 - 10.5 K/uL   RBC 4.85  4.22 - 5.81 MIL/uL   Hemoglobin 14.9  13.0 - 17.0 g/dL   HCT 44.3  39.0 - 52.0 %   MCV 91.3  78.0 - 100.0 fL   MCH 30.7  26.0 - 34.0 pg   MCHC 33.6  30.0 - 36.0  g/dL   RDW 14.3  11.5 - 15.5 %   Platelets 156  150 - 400 K/uL   Neutrophils Relative % 35 (*) 43 - 77 %   Lymphocytes Relative 57 (*) 12 - 46 %   Monocytes Relative 7  3 - 12 %   Eosinophils Relative 0  0 - 5 %   Basophils Relative 1  0 - 1 %   Neutro Abs 4.4  1.7 - 7.7 K/uL   Lymphs Abs 7.2 (*) 0.7 - 4.0 K/uL   Monocytes Absolute 0.9  0.1 - 1.0 K/uL   Eosinophils Absolute 0.0  0.0 - 0.7 K/uL   Basophils Absolute 0.1  0.0 - 0.1 K/uL   WBC Morphology ATYPICAL LYMPHOCYTES    BASIC METABOLIC PANEL     Status: Abnormal   Collection Time    09/09/13  1:30 AM      Result Value Ref Range   Sodium 138  137 - 147 mEq/L   Potassium 3.8  3.7 - 5.3 mEq/L   Chloride 101  96 - 112 mEq/L   CO2 25  19 - 32 mEq/L   Glucose, Bld 137 (*) 70 - 99 mg/dL   BUN 17  6 - 23 mg/dL   Creatinine, Ser 0.85  0.50 - 1.35 mg/dL   Calcium 9.1  8.4 - 10.5 mg/dL   GFR calc non Af Amer >90  >90 mL/min   GFR calc Af Amer >90  >90 mL/min   Comment: (NOTE)     The eGFR has been calculated using the CKD EPI equation.     This calculation has not been validated in all clinical situations.     eGFR's persistently <90 mL/min signify possible Chronic Kidney     Disease.  SEDIMENTATION RATE     Status: Abnormal   Collection Time    09/09/13  1:30 AM      Result Value Ref Range   Sed Rate 17 (*) 0 - 16 mm/hr  HEPATIC FUNCTION PANEL     Status: None   Collection Time    09/09/13  3:03 AM      Result Value Ref Range   Total Protein 7.5  6.0 - 8.3 g/dL   Albumin 3.7  3.5 - 5.2 g/dL   AST 16  0 - 37 U/L   ALT 8  0 - 53 U/L   Alkaline Phosphatase 71  39 - 117 U/L   Total Bilirubin 0.8  0.3 - 1.2 mg/dL   Bilirubin, Direct <0.2  0.0 - 0.3 mg/dL   Indirect Bilirubin NOT CALCULATED  0.3 - 0.9 mg/dL    Radiological Exams on Admission: No results found.  Assessment/Plan Principal Problem:   Cellulitis of scalp Active Problems:   Cellulitis  Cellulitis We'll start the patient on vancomycin Follow CBC in  a.m.  ? Varicella rash Patient says that he had chickenpox as a child, the rash has an appearance  of varicella eruption Will start acyclovir If no improvement, consider dermatology evaluation.  Hypertension Continue lisinopril  Code status: Patient is full code  Family discussion: No family at bedside   Time Spent on Admission: 60 minutes  [facility] Pager: [phone] 09/09/2013, 4:15 AM  If 7PM-7AM, please contact night-coverage  [email or web address]  Password [email or web address]

## 2013-09-10 LAB — BASIC METABOLIC PANEL
BUN: 13 mg/dL (ref 6–23)
CALCIUM: 8.7 mg/dL (ref 8.4–10.5)
CHLORIDE: 100 meq/L (ref 96–112)
CO2: 30 mEq/L (ref 19–32)
Creatinine, Ser: 0.93 mg/dL (ref 0.50–1.35)
GFR calc Af Amer: >90 mL/min (ref >90)
Glucose, Bld: 102 mg/dL — ABNORMAL HIGH (ref 70–99)
Potassium: 3.9 mEq/L (ref 3.7–5.3)
Sodium: 139 mEq/L (ref 137–147)

## 2013-09-10 LAB — RPR: RPR Ser Ql: NONREACTIVE

## 2013-09-10 LAB — CBC
HCT: 42 % (ref 39.0–52.0)
Hemoglobin: 14 g/dL (ref 13.0–17.0)
MCH: 31 pg (ref 26.0–34.0)
MCHC: 33.3 g/dL (ref 30.0–36.0)
MCV: 92.9 fL (ref 78.0–100.0)
PLATELETS: 164 10*3/uL (ref 150–400)
RBC: 4.52 MIL/uL (ref 4.22–5.81)
RDW: 14.5 % (ref 11.5–15.5)
WBC: 18.3 10*3/uL — AB (ref 4.0–10.5)

## 2013-09-10 LAB — HIV ANTIBODY (ROUTINE TESTING W REFLEX): HIV: NONREACTIVE

## 2013-09-10 NOTE — Progress Notes (Addendum)
PROGRESS NOTE  Thomas Bernard KGY:[id number] DOB: 22-Jan-1961 DOA: 09/09/2013 PCP: No primary provider on file. [facility]  Summary: 53 year old man admitted for further evaluation of pustular rash. Symptoms began more than one week prior to admission, beginning with bumps on the left side of his scalp which he thought was related to shaving. Initially treated with doxycycline by his primary care physician, seen in the emergency department 3/15 for failure to improve, changed to clindamycin and discharged. Presented to the emergency department again 3/17 for vomiting, rash spread from scalp to face, trunk, arms and legs. Reports he had chickenpox as a child.  Assessment/Plan: 1. Pustular rash, cellulitis of the scalp. Perhaps some improvement per patient. Most significant along the left side of the scalp in the parietal temporal area. No open lesions. No vesicles. Exam not classically suggestive of varicella. Bacterial infection remains a consideration. 2. Absolute lymphocytosis. Significance unclear. On CBC pathologist suggested immunophenotyping if a new, persistent finding.   Continue empiric vancomycin and acyclovir for now  CBC with diff in the morning. Discuss with infectious disease tomorrow.  Code Status: full code DVT prophylaxis: Lovenox Family Communication: Discussed with daughter present Disposition Plan: Home when improved  Murray Hodgkins, MD  [facility]  Pager [phone] If 7PM-7AM, please contact night-coverage at [email or web address], password [facility] 09/10/2013, 3:58 PM  LOS: 1 day   Consultants:    Procedures:    Antibiotics:  Acyclovir 3/17 >>   Vancomycin 3/17 >>   HPI/Subjective: He feels like there has been some improvement in the swelling in the left side of his head although he has noted some more swelling around his eye today. He has a headache on the left side. No itching or pain with rash today.  Objective: Filed Vitals:   09/09/13 2223 09/10/13  0632 09/10/13 1308 09/10/13 1344  BP: 146/84 144/80 137/75 157/89  Pulse: 58 63 60 67  Temp: 99.7 F (37.6 C) 99.9 F (37.7 C) 97.8 F (36.6 C) 98.3 F (36.8 C)  TempSrc: Oral Oral Oral Oral  Resp: 18 18 16 18   Height:      Weight:      SpO2: 96% 98% 97% 98%    Intake/Output Summary (Last 24 hours) at 09/10/13 1558 Last data filed at 09/10/13 1220  Gross per 24 hour  Intake    640 ml  Output      0 ml  Net    640 ml     Filed Weights   09/09/13 0112 09/09/13 0943  Weight: 90.719 kg (200 lb) 89.1 kg (196 lb 6.9 oz)    Exam:   Afebrile, vital signs stable. No hypoxia.  Gen. Appears calm and comfortable. Speech fluent and clear.  Eyes: Appear grossly normal, uninvolved. There is some edema of the left side of the face, no tenderness of the eyelids or restriction of eye movement. He denies any difficulty with vision.  Cardiovascular regular rate and rhythm. No murmur, rub or gallop. No lower extremity edema.  Respiratory clear to auscultation bilaterally. No wheezes, rales or rhonchi. Normal respiratory effort.  Abdomen soft.  Skin: Scattered pustular lesions fairly rare both arms, chest, lower legs evenly distributed. Scalp lesions predominantly on the left side with a area of induration approximately 6 cm in diameter, firm, nontender. There is some bogginess to the scalp surrounding this area extending towards the occiput. This area is entirely nontender with minimal erythema. Pustular lesions also seen on the scalp and back of the head. Few pustular lesions in the back  seat.  Data Reviewed:  Basic metabolic panel, hepatic function panel unremarkable.  WBC 12.6 with a lymphocytic predominance >> 18.3   HIV and RPR nonreactive  Scheduled Meds: . acyclovir  200 mg Oral 5 X Daily  . enoxaparin (LOVENOX) injection  40 mg Subcutaneous Q24H  . lisinopril  20 mg Oral Daily  . mupirocin ointment  1 application Topical Daily  . vancomycin  1,000 mg Intravenous Q8H    Continuous Infusions: . sodium chloride Stopped (09/09/13 1400)    Principal Problem:   Cellulitis of scalp Active Problems:   Cellulitis   Time spent 35 minutes

## 2013-09-10 NOTE — Plan of Care (Signed)
Problem: Phase I Progression Outcomes Goal: Initial discharge plan identified Outcome: Completed/Met Date Met:  09/10/13 Home at discharge.

## 2013-09-11 ENCOUNTER — Inpatient Hospital Stay (HOSPITAL_COMMUNITY): Payer: Managed Care, Other (non HMO)

## 2013-09-11 ENCOUNTER — Encounter (HOSPITAL_COMMUNITY): Payer: Self-pay | Admitting: Oncology

## 2013-09-11 DIAGNOSIS — C9110 Chronic lymphocytic leukemia of B-cell type not having achieved remission: Secondary | ICD-10-CM

## 2013-09-11 DIAGNOSIS — D72820 Lymphocytosis (symptomatic): Secondary | ICD-10-CM

## 2013-09-11 DIAGNOSIS — D72829 Elevated white blood cell count, unspecified: Secondary | ICD-10-CM

## 2013-09-11 HISTORY — DX: Chronic lymphocytic leukemia of B-cell type not having achieved remission: C91.10

## 2013-09-11 LAB — CBC WITH DIFFERENTIAL/PLATELET
BASOS ABS: 0.2 10*3/uL — AB (ref 0.0–0.1)
BASOS PCT: 1 % (ref 0–1)
Eosinophils Absolute: 0.1 10*3/uL (ref 0.0–0.7)
Eosinophils Relative: 1 % (ref 0–5)
HEMATOCRIT: 41.7 % (ref 39.0–52.0)
Hemoglobin: 14.1 g/dL (ref 13.0–17.0)
LYMPHS PCT: 79 % — AB (ref 12–46)
Lymphs Abs: 19.1 10*3/uL — ABNORMAL HIGH (ref 0.7–4.0)
MCH: 31.1 pg (ref 26.0–34.0)
MCHC: 33.8 g/dL (ref 30.0–36.0)
MCV: 91.9 fL (ref 78.0–100.0)
Monocytes Absolute: 1.9 10*3/uL — ABNORMAL HIGH (ref 0.1–1.0)
Monocytes Relative: 8 % (ref 3–12)
NEUTROS ABS: 3 10*3/uL (ref 1.7–7.7)
NEUTROS PCT: 12 % — AB (ref 43–77)
Platelets: 173 10*3/uL (ref 150–400)
RBC: 4.54 MIL/uL (ref 4.22–5.81)
RDW: 14.1 % (ref 11.5–15.5)
WBC: 24.2 10*3/uL — AB (ref 4.0–10.5)

## 2013-09-11 LAB — VANCOMYCIN, TROUGH: Vancomycin Tr: 14.8 ug/mL (ref 10.0–20.0)

## 2013-09-11 LAB — C-REACTIVE PROTEIN

## 2013-09-11 LAB — LACTATE DEHYDROGENASE: LDH: 203 U/L (ref 94–250)

## 2013-09-11 MED ORDER — IOHEXOL 300 MG/ML  SOLN
125.0000 mL | Freq: Once | INTRAMUSCULAR | Status: AC | PRN
  Administered 2013-09-11: 125 mL via INTRAVENOUS

## 2013-09-11 MED ORDER — IOHEXOL 300 MG/ML  SOLN
50.0000 mL | Freq: Once | INTRAMUSCULAR | Status: AC | PRN
  Administered 2013-09-11: 50 mL via ORAL

## 2013-09-11 NOTE — Progress Notes (Signed)
PROGRESS NOTE  Thomas Bernard [id number] DOB: September 12, 1960 DOA: 09/09/2013 PCP: No primary provider on file. [facility]  Summary: 53 year old man admitted for further evaluation of pustular rash. Symptoms began more than one week prior to admission, beginning with bumps on the left side of his scalp which he thought was related to shaving. Initially treated with doxycycline by his primary care physician, seen in the emergency department 3/15 for failure to improve, changed to clindamycin and discharged. Presented to the emergency department again 3/17 for vomiting, rash spread from scalp to face, trunk, arms and legs. Reports he had chickenpox as a child.  Assessment/Plan: 1. Pustular rash, cellulitis of the scalp. Overall somewhat improved today, nontender. Remains afebrile and nontoxic. No evidence of abscess. 2. Absolute lymphocytosis. Significance unclear. On CBC pathologist suggested immunophenotyping if a new, persistent finding.   Continue empiric vancomycin and acyclovir for now. Discussed with oncology as below. Although I do not see any evidence of abscess, we will plan on imaging of the head later today, possibly other regions as directed by oncology. As he appears to be improving, continue current management.  Significance of absolute lymphocytosis is unclear, but it is worrisome for lymphoma. This may not be an infectious process. Discussed with hematology oncology, consultation pending for later today.  Code Status: full code DVT prophylaxis: Lovenox Family Communication:  Disposition Plan: Home when improved  Murray Hodgkins, MD  [facility]  Pager [phone] If 7PM-7AM, please contact night-coverage at [email or web address], password [facility] 09/11/2013, 1:11 PM  LOS: 2 days   Consultants:    Procedures:    Antibiotics:  Acyclovir 3/17 >>   Vancomycin 3/17 >>   HPI/Subjective: Swelling of his face is improved. Still has some left-sided headache pain but overall  feeling better. No nausea or vomiting. Poor appetite but tolerating diet. No pain or itching of lesions elsewhere in body. He does note some fullness to his neck.  Objective: Filed Vitals:   09/10/13 1308 09/10/13 1344 09/10/13 2038 09/11/13 0422  BP: 137/75 157/89 157/90 132/76  Pulse: 60 67 63 52  Temp: 97.8 F (36.6 C) 98.3 F (36.8 C) 98.9 F (37.2 C) 98.1 F (36.7 C)  TempSrc: Oral Oral Oral Oral  Resp: 16 18 20 20   Height:      Weight:      SpO2: 97% 98% 96% 93%    Intake/Output Summary (Last 24 hours) at 09/11/13 1311 Last data filed at 09/11/13 1240  Gross per 24 hour  Intake   2030 ml  Output      0 ml  Net   2030 ml     Filed Weights   09/09/13 0112 09/09/13 0943  Weight: 90.719 kg (200 lb) 89.1 kg (196 lb 6.9 oz)    Exam:   Afebrile, vital signs stable. No hypoxia.  Gen. Appears calm, comfortable, nontoxic.  Respiratory clear to auscultation bilaterally. No wheezes, rales or rhonchi. Normal respiratory effort.  Cardiovascular regular rate and rhythm. No murmur, rub or gallop. No lower extremity edema.  Abdomen soft.  Skin. Few scattered lesions pustular in nature over the arms, trunk and legs. Nontender. No surrounding erythema. These are approximately 2-3 mm in size. Pustule lesions on the scalp appear improved, there is regression of erythema over the left-sided scalp. There is an area of induration over the left side of the scalp which is less firm today but not fluctuant. The scalp itself is nontender there is some bogginess to the left side of it. No fluid collection is appreciated.  Face. Edema around the left eye lids and face nearly resolved. Extraocular movements are intact. Vision intact by report.  Neck. There is fullness to both sides of the neck with some apparent lymphadenopathy which appears to be no.   Data Reviewed:  Basic metabolic panel, hepatic function panel unremarkable.  WBC 18.3  >> 24.2 with lymphocytic predominance.  LDH  normal.  HIV and RPR nonreactive  Scheduled Meds: . acyclovir  200 mg Oral 5 X Daily  . enoxaparin (LOVENOX) injection  40 mg Subcutaneous Q24H  . lisinopril  20 mg Oral Daily  . mupirocin ointment  1 application Topical Daily  . vancomycin  1,000 mg Intravenous Q8H   Continuous Infusions:    Principal Problem:   Cellulitis of scalp Active Problems:   Cellulitis   Lymphocytosis   Time spent 35 minutes

## 2013-09-11 NOTE — Consult Note (Signed)
[facility] Consultation Oncology  Name: Thomas Bernard      MRN: [id number]    Location: A313/A313-01  Date: 09/11/2013 Time:4:08 PM   REFERRING PHYSICIAN:  Murray Hodgkins, MD  REASON FOR CONSULT:  Leukocytosis   DIAGNOSIS:  CLL  HISTORY OF PRESENT ILLNESS:   Thomas Bernard is a pleasant 53 year old African American man who was admitted to the [facility] on 09/09/2013 with Varicella versus cellulitis infection of left scalp in the parietal/occipital region with a past medical history of CLL followed by Dr. Cruzita Lederer in [facility] with [facility] Hematology/Oncology.    On further discussion, the patient reports that he was seen by his PCP in [facility] who recommended admission to the hospital for skin infection.  He used to live in [place] and therefore his physician network is in that area, however, he has moved to [place] area.  He explains that he follows a Dr. [place], but that Dr. Rosanna Randy to see him in 3 years.  This recommendation is not appropriate for a patient diagnosed with CLL and does not follow NCCN guidelines pertaining to surveillance. I called his physician's office and he was last seen in 2011 with a failure for follow-up on the patient's part since then.  As a result, the office reports that he will need to be re-referred.  With this information, I discussed the patient's options, and he desire to stay with [place] at the [facility].   This gentleman reports that he has been diagnosed with CLL and his hematologist told him that this may never cause him any issues.    He denies any B symptoms including fevers, chills, night sweats, and unintentional weight loss.   He denies any complaints today except for his "rash/cellulitis."  He reports that his hematologist in the past did not tell him about any enlarged lymph nodes on exam, however, today, I feel a number of enlarged and hard lymph nodes.  See physical exam for details.   His  appetite is fair and improving every day he reports.   Hematologically, he denies any complaints and ROS questioning is negative.  PAST MEDICAL HISTORY:   Past Medical History  Diagnosis Date  . Hypertension     ALLERGIES: No Known Allergies    MEDICATIONS: I have reviewed the patient's current medications.     PAST SURGICAL HISTORY History reviewed. No pertinent past surgical history.  FAMILY HISTORY: History reviewed. No pertinent family history.  SOCIAL HISTORY:  reports that he has never smoked. He does not have any smokeless tobacco history on file. He reports that he does not drink alcohol or use illicit drugs.  PERFORMANCE STATUS: The patient's performance status is 1 - Symptomatic but completely ambulatory  PHYSICAL EXAM: Most Recent Vital Signs: Blood pressure 154/95, pulse 63, temperature 98.6 F (37 C), temperature source Oral, resp. rate 20, height $RemoveBe'6\' 1"'TxrNKogDs$  (1.854 m), weight 196 lb 6.9 oz (89.1 kg), SpO2 97.00%. General appearance: alert, cooperative, appears stated age and no distress Head: left parietal pustular rash Eyes: negative findings: lids and lashes normal, conjunctivae and sclerae normal, corneas clear and pupils equal, round, reactive to light and accomodation Neck: moderate anterior cervical adenopathy and supple, symmetrical, trachea midline Back: symmetric, no curvature. ROM normal. No CVA tenderness. Lungs: clear to auscultation bilaterally Heart: regular rate and rhythm Extremities: no edema, redness or tenderness in the calves or thighs Lymph nodes: Left anterior cervical lymph nodes, largest measuring 1.5 cm, three noted.  Right supraclavicular lymph nodes, largest measuring 0.5 cm, and there are about 5 noted. Neurologic: Grossly normal  LABORATORY DATA:  Results for orders placed during the hospital encounter of 09/09/13 (from the past 48 hour(s))  CBC     Status: Abnormal   Collection Time    09/10/13  5:18 AM      Result Value Ref Range    WBC 18.3 (*) 4.0 - 10.5 K/uL   RBC 4.52  4.22 - 5.81 MIL/uL   Hemoglobin 14.0  13.0 - 17.0 g/dL   HCT 42.0  39.0 - 52.0 %   MCV 92.9  78.0 - 100.0 fL   MCH 31.0  26.0 - 34.0 pg   MCHC 33.3  30.0 - 36.0 g/dL   RDW 14.5  11.5 - 15.5 %   Platelets 164  150 - 400 K/uL  BASIC METABOLIC PANEL     Status: Abnormal   Collection Time    09/10/13  5:18 AM      Result Value Ref Range   Sodium 139  137 - 147 mEq/L   Potassium 3.9  3.7 - 5.3 mEq/L   Chloride 100  96 - 112 mEq/L   CO2 30  19 - 32 mEq/L   Glucose, Bld 102 (*) 70 - 99 mg/dL   BUN 13  6 - 23 mg/dL   Creatinine, Ser 0.93  0.50 - 1.35 mg/dL   Calcium 8.7  8.4 - 10.5 mg/dL   GFR calc non Af Amer >90  >90 mL/min   GFR calc Af Amer >90  >90 mL/min   Comment: (NOTE)     The eGFR has been calculated using the CKD EPI equation.     This calculation has not been validated in all clinical situations.     eGFR's persistently <90 mL/min signify possible Chronic Kidney     Disease.  CBC WITH DIFFERENTIAL     Status: Abnormal   Collection Time    09/11/13  5:06 AM      Result Value Ref Range   WBC 24.2 (*) 4.0 - 10.5 K/uL   RBC 4.54  4.22 - 5.81 MIL/uL   Hemoglobin 14.1  13.0 - 17.0 g/dL   HCT 41.7  39.0 - 52.0 %   MCV 91.9  78.0 - 100.0 fL   MCH 31.1  26.0 - 34.0 pg   MCHC 33.8  30.0 - 36.0 g/dL   RDW 14.1  11.5 - 15.5 %   Platelets 173  150 - 400 K/uL   Neutrophils Relative % 12 (*) 43 - 77 %   Neutro Abs 3.0  1.7 - 7.7 K/uL   Lymphocytes Relative 79 (*) 12 - 46 %   Lymphs Abs 19.1 (*) 0.7 - 4.0 K/uL   Monocytes Relative 8  3 - 12 %   Monocytes Absolute 1.9 (*) 0.1 - 1.0 K/uL   Eosinophils Relative 1  0 - 5 %   Eosinophils Absolute 0.1  0.0 - 0.7 K/uL   Basophils Relative 1  0 - 1 %   Basophils Absolute 0.2 (*) 0.0 - 0.1 K/uL   WBC Morphology ABSOLUTE LYMPHOCYTOSIS     Comment: ATYPICAL LYMPHOCYTES  LACTATE DEHYDROGENASE     Status: None   Collection Time    09/11/13  9:45 AM      Result Value Ref Range   LDH 203  94 -  250 U/L      RADIOGRAPHY: No results found.     PATHOLOGY:  None  ASSESSMENT:  1. CLL 2. Leukocytosis with lymphocytosis with extensive lymphadenopathy 3. 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 4. Necrotic area on CT scan of the neck possibly representing reactive process or metastatic disease from a possible primary squamous cell carcinoma of the skin which is seen not infrequently in patients with CLL.  Patient Active Problem List   Diagnosis Date Noted  . Lymphocytosis 09/11/2013  . CLL (chronic lymphocytic leukemia) 09/11/2013  . Cellulitis of scalp 09/09/2013  . Cellulitis 09/09/2013    PLAN:  1. I personally reviewed and went over laboratory results with the patient.  The results are noted within this dictation. 2. I personally reviewed and went over radiographic studies with the patient.  The results are noted within this dictation.   3. Chart reviewed 4. CT CAP with contrast and CT of neck with contrast to evaluate for lymphadenopathy and splenomegaly. 5. Labs today: LDH, B2M, CRP, SPEP with IFE (please quantify subgroups of IgG), ZAP 70, uric acid. 6. Labs tomorrow: Peripheral flow cytometry to evaluate for CLL 7. Recommend the patient be placed on Bactrim DS and Acyclovir as an outpatient.  8.  Will follow along while patient is in the hospital 9. Outpatient appointment at the [facility] on 3/27 at 9 AM.  10. Recommend dermatology consult as well for evaluation and possible biopsy if rash does not clear as an outpatient.  All questions were answered. The patient knows to call the clinic with any problems, questions or concerns. We can certainly see the patient much sooner if necessary.  Patient and plan discussed with Dr. Farrel Gobble and he is in agreement with the aforementioned.     KEFALAS,THOMAS 09/11/2013

## 2013-09-11 NOTE — Progress Notes (Signed)
ANTIBIOTIC CONSULT NOTE  Pharmacy Consult for Vancomycin Indication: cellulitis  No Known Allergies  Patient Measurements: Height: 6\' 1"  (185.4 cm) Weight: 196 lb 6.9 oz (89.1 kg) IBW/kg (Calculated) : 79.9  Vital Signs: Temp: 98.6 F (37 C) (03/19 1416) Temp src: Oral (03/19 1416) BP: 154/95 mmHg (03/19 1416) Pulse Rate: 63 (03/19 1416) Intake/Output from previous day: 03/18 0701 - 03/19 0700 In: 8786 [P.O.:480; I.V.:710; IV Piggyback:600] Out: -  Intake/Output from this shift: Total I/O In: 880 [P.O.:480; IV Piggyback:400] Out: -   Labs:  Recent Labs  09/09/13 0130 09/10/13 0518 09/11/13 0506  WBC 12.6* 18.3* 24.2*  HGB 14.9 14.0 14.1  PLT 156 164 173  CREATININE 0.85 0.93  --    Estimated Creatinine Clearance: 102.6 ml/min (by C-G formula based on Cr of 0.93).  Recent Labs  09/11/13 1702  VANCOTROUGH 14.8     Microbiology: No results found for this or any previous visit (from the past 720 hour(s)).  Medical History: Past Medical History  Diagnosis Date  . Hypertension   . CLL (chronic lymphocytic leukemia) 09/11/2013    Medications:  Scheduled:  . acyclovir  200 mg Oral 5 X Daily  . enoxaparin (LOVENOX) injection  40 mg Subcutaneous Q24H  . lisinopril  20 mg Oral Daily  . mupirocin ointment  1 application Topical Daily  . vancomycin  1,000 mg Intravenous Q8H   Assessment: 53 yo M with skin infection of the scalp which started ~ 1 week prior to admission.  He was on oral doxycycline PTA with no improvement.  This was switched to clindamycin which caused him nausea.  He was admitted for IV Vancomycin.  He has also been started on acyclovir due to possible varicella.   He remains afebrile & is noted to be clinically improving.  WBC continues to rise.  Renal function has been stable.   Vancomycin trough at goal.  Vancomycin 3/17>> Acyclovir 3/17>>  Goal of Therapy:  Vancomycin trough level 10-15 mcg/ml  Plan:  Continue Vancomycin 1gm IV  q8h Weekly Vancomycin trough  Monitor renal function   Duration of therapy per MD- noted plan for oral antibiotic at discharge  Biagio Borg 09/11/2013,6:53 PM

## 2013-09-11 NOTE — Care Management Note (Signed)
    Page 1 of 1   09/11/2013     1:44:51 PM   CARE MANAGEMENT NOTE 09/11/2013  Patient:  Thomas Bernard, Thomas Bernard   Account Number:  [id number]  Date Initiated:  09/13/2013  Documentation initiated by:  Theophilus Kinds  Subjective/Objective Assessment:   Pt admitted from home with cellulitis. Pt lives with his girlfriend and will return home at discharge. Pt is independent with ADL's.     Action/Plan:   No CM needs noted.   Anticipated DC Date:  09/13/2013   Anticipated DC Plan:  [place]  CM consult      Choice offered to / List presented to:             Status of service:  Completed, signed off Medicare Important Message given?   (If response is "NO", the following Medicare IM given date fields will be blank) Date Medicare IM given:   Date Additional Medicare IM given:    Discharge Disposition:  HOME/SELF CARE  Per UR Regulation:    If discussed at Long Length of Stay Meetings, dates discussed:    Comments:  09/11/13 [place], RN BSN CM

## 2013-09-12 LAB — CBC WITH DIFFERENTIAL/PLATELET
Basophils Absolute: 0.1 10*3/uL (ref 0.0–0.1)
Basophils Relative: 1 % (ref 0–1)
EOS ABS: 0.1 10*3/uL (ref 0.0–0.7)
Eosinophils Relative: 1 % (ref 0–5)
HCT: 41.9 % (ref 39.0–52.0)
Hemoglobin: 14.1 g/dL (ref 13.0–17.0)
LYMPHS ABS: 21 10*3/uL — AB (ref 0.7–4.0)
LYMPHS PCT: 80 % — AB (ref 12–46)
MCH: 30.7 pg (ref 26.0–34.0)
MCHC: 33.7 g/dL (ref 30.0–36.0)
MCV: 91.3 fL (ref 78.0–100.0)
Monocytes Absolute: 1.7 10*3/uL — ABNORMAL HIGH (ref 0.1–1.0)
Monocytes Relative: 6 % (ref 3–12)
Neutro Abs: 3.5 10*3/uL (ref 1.7–7.7)
Neutrophils Relative %: 13 % — ABNORMAL LOW (ref 43–77)
PLATELETS: 189 10*3/uL (ref 150–400)
RBC: 4.59 MIL/uL (ref 4.22–5.81)
RDW: 14.2 % (ref 11.5–15.5)
WBC: 26.4 10*3/uL — AB (ref 4.0–10.5)

## 2013-09-12 LAB — BASIC METABOLIC PANEL
BUN: 12 mg/dL (ref 6–23)
CHLORIDE: 101 meq/L (ref 96–112)
CO2: 29 mEq/L (ref 19–32)
Calcium: 8.9 mg/dL (ref 8.4–10.5)
Creatinine, Ser: 0.88 mg/dL (ref 0.50–1.35)
GFR calc non Af Amer: >90 mL/min (ref >90)
Glucose, Bld: 99 mg/dL (ref 70–99)
Potassium: 3.7 mEq/L (ref 3.7–5.3)
Sodium: 140 mEq/L (ref 137–147)

## 2013-09-12 LAB — KAPPA/LAMBDA LIGHT CHAINS
KAPPA, LAMDA LIGHT CHAIN RATIO: 0.84 (ref 0.26–1.65)
Kappa free light chain: 1.71 mg/dL (ref 0.33–1.94)
LAMDA FREE LIGHT CHAINS: 2.04 mg/dL (ref 0.57–2.63)

## 2013-09-12 LAB — URIC ACID: Uric Acid, Serum: 5.3 mg/dL (ref 4.0–7.8)

## 2013-09-12 NOTE — Progress Notes (Signed)
PROGRESS NOTE  Thomas Bernard [id number] DOB: [birth year] DOA: 09/09/2013 PCP: No primary provider on file. [facility]  Summary: 53 year old man admitted for further evaluation of pustular rash. Symptoms began more than one week prior to admission, beginning with bumps on the left side of his scalp which he thought was related to shaving. Initially treated with doxycycline by his primary care physician, seen in the emergency department 3/15 for failure to improve, changed to clindamycin and discharged. Presented to the emergency department again 3/17 for vomiting, rash spread from scalp to face, trunk, arms and legs. Reports he had chickenpox as a child.  Assessment/Plan: 1. Pustular rash, cellulitis of the scalp. Markedly improved. Remains afebrile and nontoxic. No evidence of abscess. 2. CLL, known diagnosis. Has followup with the [place] here at [facility].   Patient has improved tremendously in last 24 hours. CT findings consistent with cellulitis. Continue IV vancomycin today, change to oral antibiotics 3/21, anticipate discharge home 3/21.  Appreciate oncology evaluation and recommendations. Followup has been arranged, greatly appreciated.  Code Status: full code DVT prophylaxis: Lovenox Family Communication:  Disposition Plan: Home when improved  Murray Hodgkins, MD  [facility]  Pager [phone] If 7PM-7AM, please contact night-coverage at [email or web address], password [email or web address] 09/12/2013, 3:21 PM  LOS: 3 days   Consultants:  Hematology  Procedures:    Antibiotics:  Acyclovir 3/17 >>   Vancomycin 3/17 >>   HPI/Subjective: He feels better. No vomiting. No abdominal pain. The lesions on his chest extremities nonpainful, no itching. Overall head feels better. No new problems.  Objective: Filed Vitals:   09/11/13 0422 09/11/13 1416 09/11/13 2130 09/12/13 0525  BP: 132/76 154/95 160/103 146/95  Pulse: 52 63 71 59  Temp: 98.1 F (36.7 C) 98.6 F (37 C) 98.7 F  (37.1 C) 98.2 F (36.8 C)  TempSrc: Oral Oral Oral Oral  Resp: 20 20 20 20   Height:      Weight:      SpO2: 93% 97% 97% 96%    Intake/Output Summary (Last 24 hours) at 09/12/13 1521 Last data filed at 09/12/13 0800  Gross per 24 hour  Intake   1080 ml  Output      0 ml  Net   1080 ml     Filed Weights   09/09/13 0112 09/09/13 0943  Weight: 90.719 kg (200 lb) 89.1 kg (196 lb 6.9 oz)    Exam:   Afebrile, vital signs stable. No hypoxia.  Gen. Appears better today, calm and comfortable.  Cardiovascular regular rate and rhythm. No murmur, rub or gallop.  Respiratory clear to auscultation bilaterally. No wheezes, rales or rhonchi. Normal respiratory effort.  Skin. There are a few pustular lesions both legs, no new lesions. The remainder of the lesions on his chest and arms have been deroofed  and are drying. His face appears symmetric with no edema. He still has some neck fullness. His scalp appears markedly improved today with near resolution of erythema. No pustules remain. All lesions are now dry. Induration in the left side of the scalp in the temporal parietal region is soft and decreasing in size.  Data Reviewed:  Basic metabolic panel unremarkable.  CT of the head demonstrated scalp thickening consistent with cellulitis. No evidence of complicating features. CT of the neck, chest, abdomen and pelvis revealed cervical, supraclavicular, bilateral external iliac lymphadenopathy consistent with history of CLL. Some cavitary nose was seen in the left cervical chain and left parotid area, favor supportive change related to ipsilateral scalp cellulitis.  Scheduled Meds: . acyclovir  200 mg Oral 5 X Daily  . enoxaparin (LOVENOX) injection  40 mg Subcutaneous Q24H  . lisinopril  20 mg Oral Daily  . mupirocin ointment  1 application Topical Daily  . vancomycin  1,000 mg Intravenous Q8H   Continuous Infusions:    Principal Problem:   Cellulitis of scalp Active Problems:    Cellulitis   Lymphocytosis   CLL (chronic lymphocytic leukemia)   Time spent 20 minutes

## 2013-09-13 MED ORDER — SULFAMETHOXAZOLE-TMP DS 800-160 MG PO TABS: 2.0000 | ORAL_TABLET | Freq: Two times a day (BID) | ORAL | Status: DC

## 2013-09-13 MED ORDER — ACYCLOVIR 200 MG PO CAPS: 200.0000 mg | ORAL_CAPSULE | Freq: Every day | ORAL | Status: DC

## 2013-09-13 NOTE — Progress Notes (Signed)
PROGRESS NOTE  Thomas Bernard [id number] DOB: 1960-10-22 DOA: 09/09/2013 PCP: No primary provider on file. [facility]  Summary: 53 year old man admitted for further evaluation of pustular rash. Symptoms began more than one week prior to admission, beginning with bumps on the left side of his scalp which he thought was related to shaving. Initially treated with doxycycline by his primary care physician, seen in the emergency department 3/15 for failure to improve, changed to clindamycin and discharged. Presented to the emergency department again 3/17 for vomiting, rash spread from scalp to face, trunk, arms and legs. Reports he had chickenpox as a child.  Assessment/Plan: 1. Pustular rash, cellulitis/folliculitis of the scalp. Improving rapidly Remains afebrile and nontoxic. No evidence of abscess. History most suggestive of bacterial infection. No evidence of viral infection. Nevertheless, complete empiric course of acyclovir given h/o CLL. 2. CLL, known diagnosis. Has followup with the [place] here at [facility].   Change to oral antibiotics. Discharge home today. He failed on oral doxycycline as an outpatient and had GI intolerance to clindamycin. Exam is consistent with folliculitis, most likely Staph aureus.  Outpatient appointment at the [facility] on 3/27 at 9 AM.   Code Status: full code DVT prophylaxis: Lovenox Family Communication:  Disposition Plan: Home when improved  Murray Hodgkins, MD  [facility]  Pager [phone] If 7PM-7AM, please contact night-coverage at [email or web address], password [facility] 09/13/2013, 12:30 PM  LOS: 4 days   Consultants:  Hematology  Procedures:    Antibiotics:  Acyclovir 3/17 >> 3/26  Vancomycin 3/17 >> 3/21  Bactrim 3/21 >> 3/26   HPI/Subjective: Continues to improve. No nausea or vomiting. Tolerating diet. No new lesions. Head feels better.  Objective: Filed Vitals:   09/11/13 2130 09/12/13 0525 09/12/13 2247  09/13/13 0640  BP: 160/103 146/95 168/90 154/92  Pulse: 71 59 57 57  Temp: 98.7 F (37.1 C) 98.2 F (36.8 C) 99.7 F (37.6 C) 98 F (36.7 C)  TempSrc: Oral Oral Oral Oral  Resp: 20 20 20 20   Height:      Weight:      SpO2: 97% 96% 96% 95%    Intake/Output Summary (Last 24 hours) at 09/13/13 1230 Last data filed at 09/13/13 0500  Gross per 24 hour  Intake    840 ml  Output      0 ml  Net    840 ml     Filed Weights   09/09/13 0112 09/09/13 0943  Weight: 90.719 kg (200 lb) 89.1 kg (196 lb 6.9 oz)    Exam:   Afebrile, vital signs stable. No hypoxia.  Gen. Appears better.  Cardiovascular regular rate and rhythm. No murmur, rub or gallop.  Respiratory clear to auscultation bilaterally. No wheezes, rales or rhonchi. Normal respiratory effort.  Skin. No new lesions seen. Overall remarkably improved. There is no facial edema. The scalp erythema has completely resolved. There are no new pustules. All areas are dry, crusted. There is less induration, no tenderness. The large area of induration his rapidly improving.  Neck. No new issues. Less swollen overall.  Data Reviewed:    Scheduled Meds: . acyclovir  200 mg Oral 5 X Daily  . enoxaparin (LOVENOX) injection  40 mg Subcutaneous Q24H  . lisinopril  20 mg Oral Daily  . mupirocin ointment  1 application Topical Daily  . vancomycin  1,000 mg Intravenous Q8H   Continuous Infusions:    Principal Problem:   Cellulitis of scalp Active Problems:   Cellulitis  Lymphocytosis   CLL (chronic lymphocytic leukemia)

## 2013-09-13 NOTE — Progress Notes (Signed)
Patient d/c to home with family in stable condition. Reviewed d/c instructions and medication schedule. Understanding voiced.

## 2013-09-13 NOTE — Discharge Summary (Signed)
Physician Discharge Summary  Thomas Bernard [id number] DOB: 01/30/1961 DOA: 09/09/2013  PCP: No primary provider on file.  Admit date: 09/09/2013 Discharge date: 09/13/2013  Recommendations for Outpatient Follow-up:  1. Resolution of scalp cellulitis, folliculitis. 2. Followup lymphadenopathy, CLL   Follow-up Information   Follow up with Thomas Bernard On 09/19/2013. (appointment with MD at 9 AM, arrive at the clinic at 8:30 AM)    Contact information:   [address] [place] [address]      Discharge Diagnoses:  1. Cellulitis, folliculitis of the scalp with pustular rash 2. Known diagnosis of CLL with lymphadenopathy  Discharge Condition: Improved Disposition: Home.  Diet recommendation: Regular.  Filed Weights   09/09/13 0112 09/09/13 0943  Weight: 90.719 kg (200 lb) 89.1 kg (196 lb 6.9 oz)    History of present illness:  53 year old man admitted for further evaluation of pustular rash. Symptoms began more than one week prior to admission, beginning with bumps on the left side of his scalp which he thought was related to shaving. Initially treated with doxycycline by his primary care physician, seen in the emergency department 3/15 for failure to improve, changed to clindamycin and discharged. Presented to the emergency department again 3/17 for vomiting, rash spread from scalp to face, trunk, arms and legs; was unable to tolerate clindamycin. Reports he had chickenpox as a child.  Hospital Course:  Patient was admitted to the medical floor and started on empiric acyclovir and IV vancomycin. His history, symptomology and exam are most consistent with a folliculitis/scalp cellulitis rather than varicella, but he was continued on acyclovir because of immunocompromise. CT imaging confirmed cellulitis without complicating features. He is rapidly improved over the last 48 hours, stable for discharge today on oral antibiotics as below. He was seen by oncology  for leukocytosis, further history revealed known history of CLL.  1. Pustular rash, cellulitis/folliculitis of the scalp. Improving rapidly Remains afebrile and nontoxic. No evidence of abscess. History most suggestive of bacterial infection. No evidence of viral infection. Nevertheless, complete empiric course of acyclovir given h/o CLL. 2. CLL, known diagnosis. Has followup with the [place] here at [facility]. Change to oral antibiotics. Discharge home today. He failed on oral doxycycline as an outpatient and had GI intolerance to clindamycin. Exam is consistent with folliculitis, most likely Staph aureus.  Outpatient appointment at the [facility] on 3/27 at 9 AM.   Consultants:  Hematology Procedures: none Antibiotics:  Acyclovir 3/17 >> 3/26  Vancomycin 3/17 >> 3/21  Bactrim 3/21 >> 3/26  Discharge Instructions  Discharge Orders   Future Appointments Provider Department Dept Phone   09/19/2013 9:00 AM [facility] Covering Provider [place] [phone]   Future Orders Complete By Expires   Activity as tolerated - No restrictions  As directed    Diet general  As directed    Discharge instructions  As directed    Comments:     Be sure to finish acyclovir and Bactrim. Call your physician or seek immediate medical attention for fever, swelling or worsening of condition. Followup with your primary care physician within one week.       Medication List    STOP taking these medications       clindamycin 150 MG capsule  Commonly known as:  CLEOCIN      TAKE these medications       acyclovir 200 MG capsule  Commonly known as:  ZOVIRAX  Take 1 capsule (200 mg total)  by mouth 5 (five) times daily.     HYDROcodone-acetaminophen 5-325 MG per tablet  Commonly known as:  NORCO/VICODIN  Take 1 tablet by mouth every 6 (six) hours as needed.     lisinopril 20 MG tablet  Commonly known as:  PRINIVIL,ZESTRIL  Take 20 mg by mouth daily.     mupirocin  ointment 2 %  Commonly known as:  BACTROBAN  Apply 1 application topically daily.     naproxen sodium 220 MG tablet  Commonly known as:  ANAPROX  Take 220 mg by mouth 2 (two) times daily as needed (pain).     sulfamethoxazole-trimethoprim 800-160 MG per tablet  Commonly known as:  BACTRIM DS  Take 2 tablets by mouth every 12 (twelve) hours.       No Known Allergies  The results of significant diagnostics from this hospitalization (including imaging, microbiology, ancillary and laboratory) are listed below for reference.    Significant Diagnostic Studies: Ct Head W Wo Contrast  09/11/2013   CLINICAL DATA:  Cellulitis after shaving. No history of injury. CLL.  EXAM: CT HEAD WITHOUT AND WITH CONTRAST  TECHNIQUE: Contiguous axial images were obtained from the base of the skull through the vertex without and with intravenous contrast  CONTRAST:  141mL OMNIPAQUE IOHEXOL 300 MG/ML SOLN, 28mL OMNIPAQUE IOHEXOL 300 MG/ML SOLN  COMPARISON:  None.  FINDINGS: Skull and Sinuses:There is skin thickening and subcutaneous reticulation in the left parietal scalp. The temporalis muscle does not appear asymmetrically thickened. There is no abscess or evidence of osteomyelitis. No evidence of intracranial infection. No evidence of dural venous sinus thrombosis. Reactive intra parotid adenopathy on the left.  Orbits: No acute abnormality.  Brain: No evidence of acute abnormality, such as acute infarction, hemorrhage, hydrocephalus, or mass lesion/mass effect. No abnormal intracranial enhancement.  IMPRESSION: Left parietal scalp thickening consistent with history of cellulitis. No abscess, osteomyelitis, or intracranial spread.   Electronically Signed   By: Jorje Guild M.D.   On: 09/11/2013 21:44   Ct Soft Tissue Neck W Contrast  09/11/2013   CLINICAL DATA:  Evaluate for lymphadenopathy. Chronic lymphocytic leukemia.  EXAM: CT NECK, CHEST, ABDOMEN, AND PELVIS WITH CONTRAST  TECHNIQUE: Multidetector CT imaging  of the neck, chest, abdomen and pelvis was performed following the standard protocol during bolus administration of intravenous contrast.  CONTRAST:  131mL OMNIPAQUE IOHEXOL 300 MG/ML SOLN, 50mL OMNIPAQUE IOHEXOL 300 MG/ML SOLN  COMPARISON:  None.  FINDINGS: CT NECK FINDINGS  Bilateral cervical lymphadenopathy with homogeneous enhancement. The largest nodes are in the posterior triangles and supraclavicular stations. Index nodes in the posterior triangle: on the left measuring 27 x 15 mm on image 57 and on the right 13 x 19 mm on image 64. There is bilateral submental and submandibular lymphadenopathy, index node in the right submandibular station 19 x 10 mm. There is a 12 mm nodule in the left parotid tail is likely adenopathy rather than a primary parotid mass.  In the left upper cervical chain, there are rounded nodes with central low-attenuation, at least 3 in number. Similar changes effect an intra parotid node on the left. The largest cavitary/necrotic node measures 11 mm in diameter.  The lymphoid tissues of Waldeyer's ring are not thickened. No evidence of mass along the surfaces of the aerodigestive tract.  No acute osseous findings.  CT CHEST FINDINGS  THORACIC INLET/BODY WALL:  The largest bilateral axillary nodes have expanded fatty hila. Supraclavicular adenopathy described on dedicated neck imaging. Negative thyroid.  MEDIASTINUM:  Normal heart size. No pericardial effusion. No acute vascular abnormality. Borderline dilated lower right periesophageal node at 6 mm maximal diameter.  LUNG WINDOWS:  No consolidation.  No effusion.  No suspicious pulmonary nodule.  OSSEOUS:  No acute fracture.  No suspicious lytic or blastic lesions.  CT ABDOMEN AND PELVIS FINDINGS  BODY WALL: Unremarkable.  Liver: There is a 5 mm low-density focus in the central anterior liver. No hepatic enlargement.  Biliary: No evidence of biliary obstruction or stone.  Pancreas: Unremarkable.  Spleen: Borderline splenomegaly with a  6.5 cm maximal thickness. Maximal span measures up to 13 cm.  Adrenals: Unremarkable.  Kidneys and ureters: No hydronephrosis or stone.  Bladder: Unremarkable.  Reproductive: Unremarkable.  Bowel: No obstruction. Normal appendix.  Retroperitoneum: No periaortic or preaortic lymphadenopathy. The portacaval node is non enlarged at 11 mm short axis. Enlarged right external iliac node measuring 14 mm short axis. Enlarged left external iliac node at 11 x 30 mm. The bilateral inguinal nodes are prominent, but not strictly enlarged.  Peritoneum: No free fluid or gas.  Vascular: No acute abnormality.  OSSEOUS: No acute abnormalities.  IMPRESSION: 1. Cervical, supraclavicular, and bilateral external iliac lymphadenopathy compatible with history of CLL. The spleen is borderline enlarged. 2. Cavitary nodes in the upper left cervical chain and left parotid are unexpected in untreated lymphoma. Favor suppurative change related to ipsilateral scalp cellulitis. Recommend skin exam to ensure no evidence of squamous cell carcinoma in the left neck drainage.   Electronically Signed   By: Jorje Guild M.D.   On: 09/11/2013 22:07   Labs: Basic Metabolic Panel:  Recent Labs Lab 09/07/13 2044 09/09/13 0130 09/10/13 0518 09/12/13 0750  NA 139 138 139 140  K 3.6* 3.8 3.9 3.7  CL 100 101 100 101  CO2 26 25 30 29   GLUCOSE 127* 137* 102* 99  BUN 17 17 13 12   CREATININE 0.98 0.85 0.93 0.88  CALCIUM 9.4 9.1 8.7 8.9   Liver Function Tests:  Recent Labs Lab 09/07/13 2044 09/09/13 0303  AST 18 16  ALT 9 8  ALKPHOS 70 71  BILITOT 0.8 0.8  PROT 7.2 7.5  ALBUMIN 3.7 3.7   CBC:  Recent Labs Lab 09/07/13 2044 09/09/13 0130 09/10/13 0518 09/11/13 0506 09/12/13 0522  WBC 11.6* 12.6* 18.3* 24.2* 26.4*  NEUTROABS 5.0 4.4  --  3.0 3.5  HGB 16.0 14.9 14.0 14.1 14.1  HCT 47.5 44.3 42.0 41.7 41.9  MCV 92.1 91.3 92.9 91.9 91.3  PLT 161 156 164 173 189    Principal Problem:   Cellulitis of scalp Active  Problems:   Cellulitis   Lymphocytosis   CLL (chronic lymphocytic leukemia)   Time coordinating discharge: 35 minutes  Signed:  Murray Hodgkins, MD [facility] 09/13/2013, 12:50 PM

## 2013-09-15 LAB — PROTEIN ELECTROPHORESIS, SERUM
ALBUMIN ELP: 54.5 % — AB (ref 55.8–66.1)
ALPHA-1-GLOBULIN: 5.3 % — AB (ref 2.9–4.9)
ALPHA-2-GLOBULIN: 11.4 % (ref 7.1–11.8)
Beta 2: 6 % (ref 3.2–6.5)
Beta Globulin: 5.5 % (ref 4.7–7.2)
GAMMA GLOBULIN: 17.3 % (ref 11.1–18.8)
M-Spike, %: NOT DETECTED g/dL
Total Protein ELP: 6.3 g/dL (ref 6.0–8.3)

## 2013-09-15 LAB — IMMUNOFIXATION ELECTROPHORESIS
IGA: 122 mg/dL (ref 68–379)
IGM, SERUM: 37 mg/dL — AB (ref 41–251)
IgG (Immunoglobin G), Serum: 1170 mg/dL (ref 650–1600)
Total Protein ELP: 6.1 g/dL (ref 6.0–8.3)

## 2013-09-15 LAB — BETA 2 MICROGLOBULIN, SERUM: BETA 2 MICROGLOBULIN: 2.69 mg/L — AB (ref <=2.51)

## 2013-09-15 NOTE — Progress Notes (Signed)
UR chart review completed.  

## 2013-09-17 LAB — ZAP-70
Viability (ZAP70): 84 %
ZAP-70: 2 % (ref <10)

## 2013-09-19 ENCOUNTER — Ambulatory Visit (HOSPITAL_COMMUNITY): Payer: Managed Care, Other (non HMO)

## 2013-09-20 NOTE — Progress Notes (Signed)
This encounter was created in error - please disregard.

## 2014-11-13 ENCOUNTER — Other Ambulatory Visit: Payer: Self-pay | Admitting: *Deleted

## 2015-03-05 IMAGING — CT CT NECK W/ CM
5 of 8 series · 12 of 33 positions shown, 13 images · IV contrast (Omnipaque 300)
Comparison: None.

CLINICAL DATA: Evaluate for lymphadenopathy. Chronic lymphocytic
leukemia.

EXAM:
CT NECK, CHEST, ABDOMEN, AND PELVIS WITH CONTRAST
TECHNIQUE: Multidetector CT imaging of the neck, chest, abdomen and pelvis was
performed following the standard protocol during bolus
administration of intravenous contrast.
CONTRAST:  125mL OMNIPAQUE IOHEXOL 300 MG/ML SOLN, 50mL OMNIPAQUE
IOHEXOL 300 MG/ML SOLN

[Series 2: cap with 5.0 b40f · axial · 0.74mm/px · z∈[-506,-176]mm · 3 of 132 slices shown, 4 images]
[im 33/132  soft-tissue]
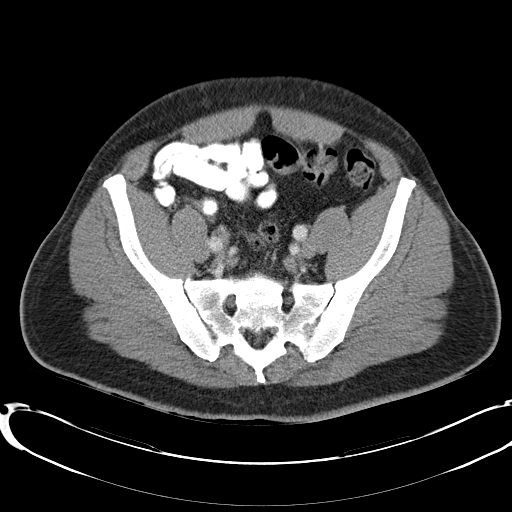
[im 33/132  bone]
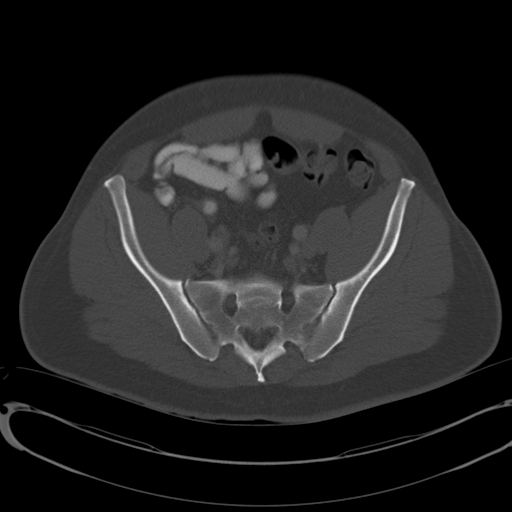
[im 66/132  bone]
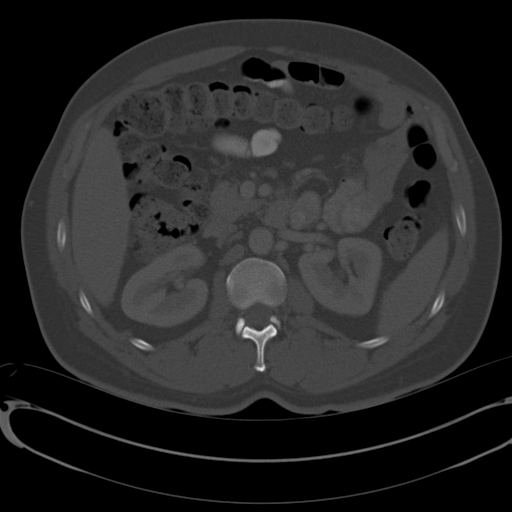
[im 99/132  bone]
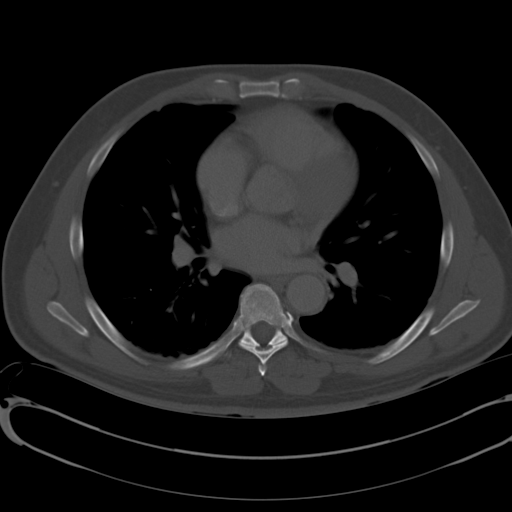

[Series 4: mpr cor post contrast (id) · coronal · 0.76mm/px · 1 of 100 slices shown]
[im 50/100  bone]
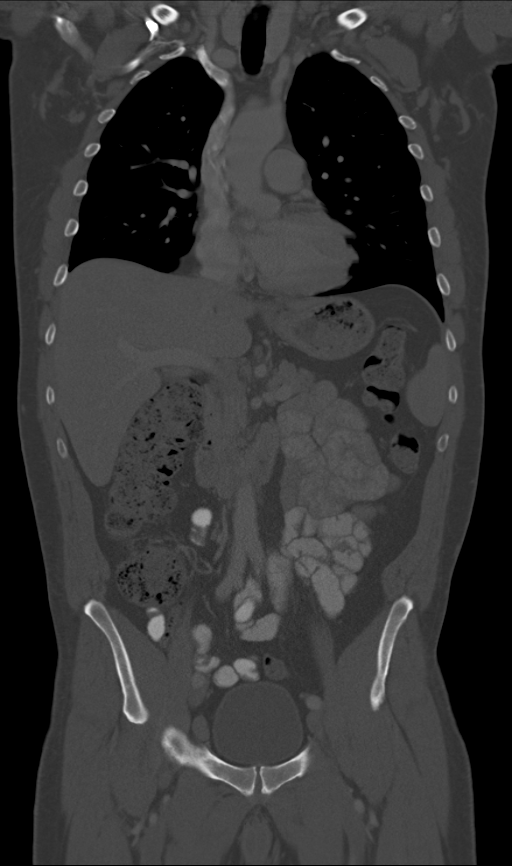

[Series 5: mpr sag post contrast (id) · sagittal · 0.69mm/px · 4 of 121 slices shown]
[im 25/121  bone]
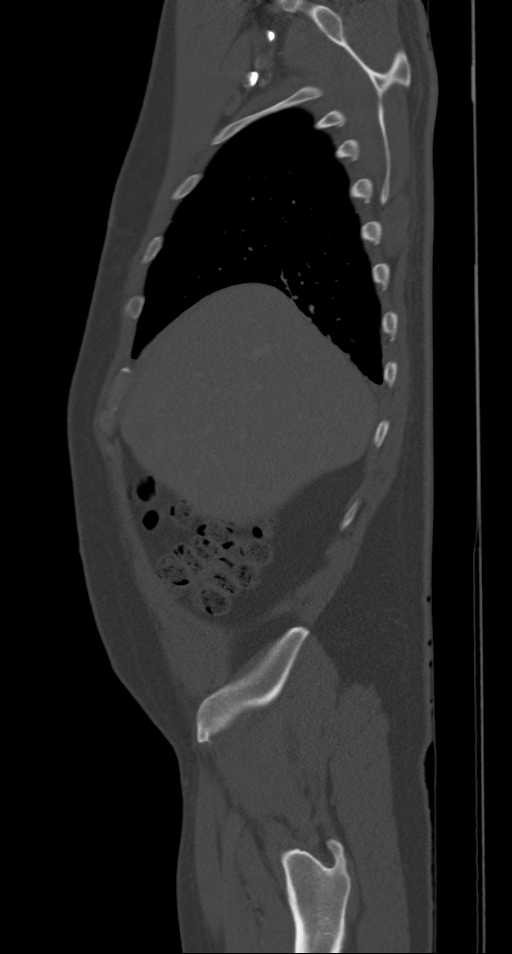
[im 49/121  bone]
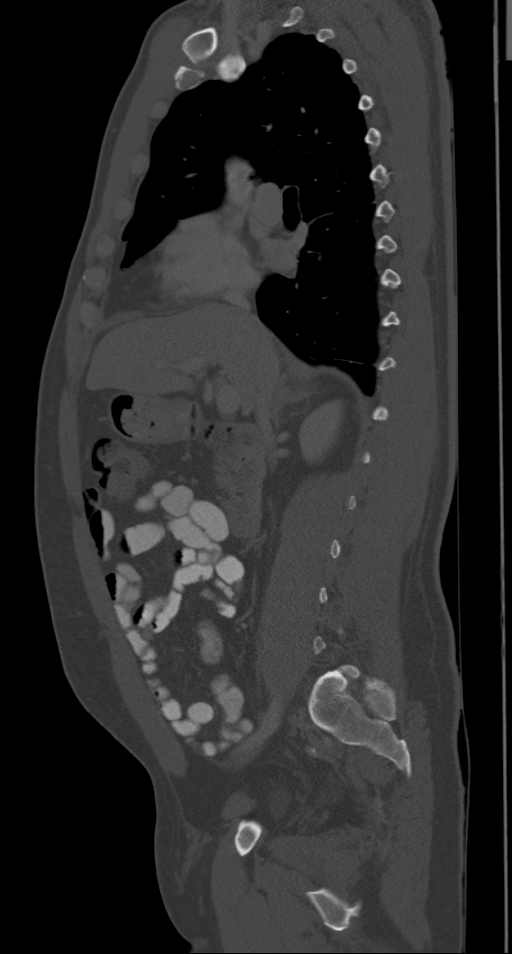
[im 73/121  bone]
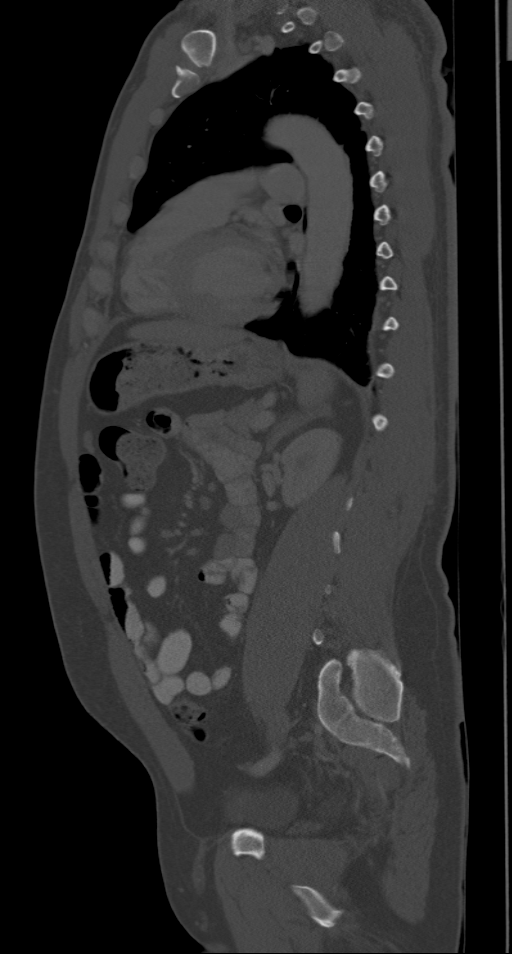
[im 97/121  bone]
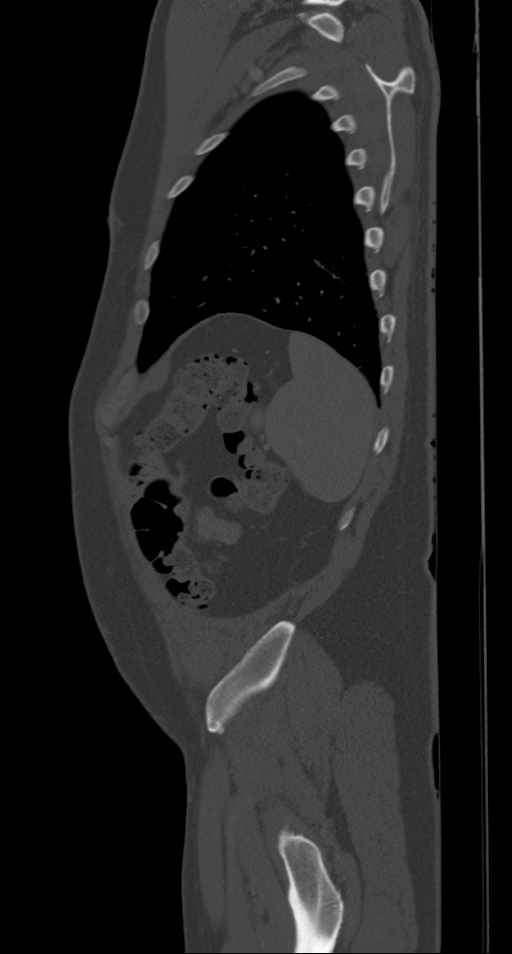

[Series 7: soft tissue neck 2.0 b31s · axial · 0.49mm/px · z∈[-28,+46]mm · 2 of 112 slices shown]
[im 38/112  soft-tissue]
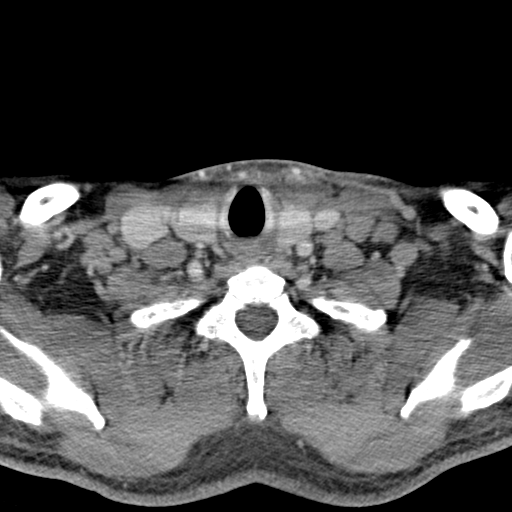
[im 75/112  soft-tissue]
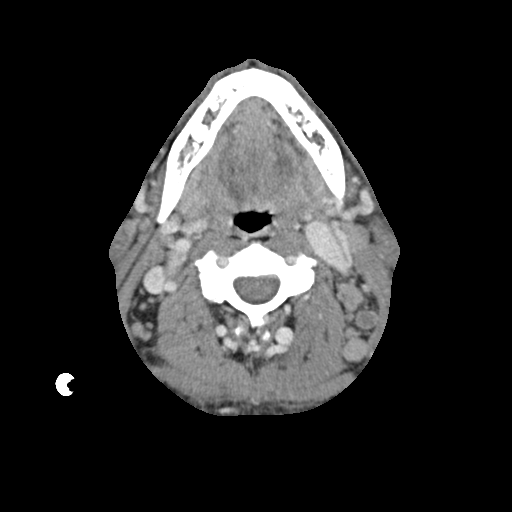

[Series 12: axial soft tissue neck 2.0 · axial · 0.43mm/px · z∈[-61,+19]mm · 2 of 123 slices shown]
[im 41/123  soft-tissue]
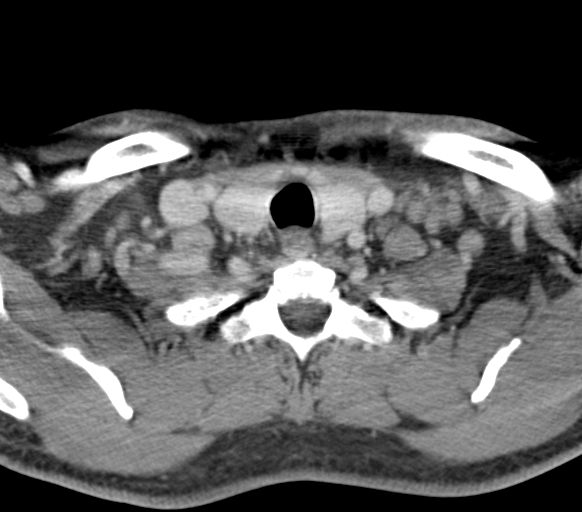
[im 82/123  soft-tissue]
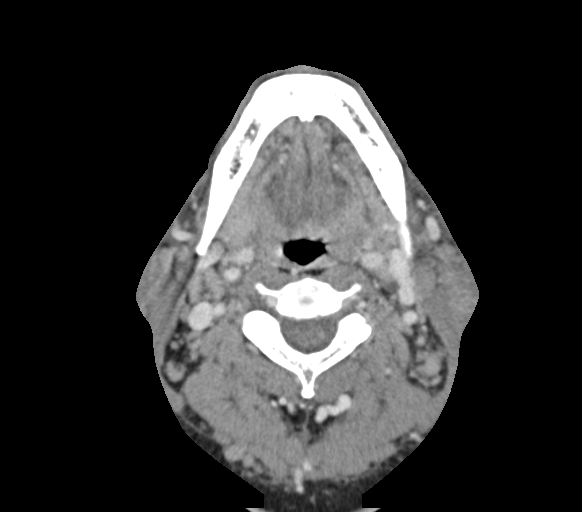

[12 of 33 positions shown; findings below may reference images not displayed]

FINDINGS: CT NECK FINDINGS

Bilateral cervical lymphadenopathy with homogeneous enhancement. The
largest nodes are in the posterior triangles and supraclavicular
stations. Index nodes in the posterior triangle: on the left
measuring 27 x 15 mm on image 57 and on the right 13 x 19 mm on
image 64. There is bilateral submental and submandibular
lymphadenopathy, index node in the right submandibular station 19 x
10 mm. There is a 12 mm nodule in the left parotid tail is likely
adenopathy rather than a primary parotid mass.

In the left upper cervical chain, there are rounded nodes with
central low-attenuation, at least 3 in number. Similar changes
effect an intra parotid node on the left. The largest
cavitary/necrotic node measures 11 mm in diameter.

The lymphoid tissues of Waldeyer's ring are not thickened. No
evidence of mass along the surfaces of the aerodigestive tract.

No acute osseous findings.

CT CHEST FINDINGS

THORACIC INLET/BODY WALL:

The largest bilateral axillary nodes have expanded fatty hila.
Supraclavicular adenopathy described on dedicated neck imaging.
Negative thyroid.

MEDIASTINUM:

Normal heart size. No pericardial effusion. No acute vascular
abnormality. Borderline dilated lower right periesophageal node at 6
mm maximal diameter.

LUNG WINDOWS:

No consolidation.  No effusion.  No suspicious pulmonary nodule.

OSSEOUS:

No acute fracture.  No suspicious lytic or blastic lesions.

CT ABDOMEN AND PELVIS FINDINGS

BODY WALL: Unremarkable.

Liver: There is a 5 mm low-density focus in the central anterior
liver. No hepatic enlargement.

Biliary: No evidence of biliary obstruction or stone.

Pancreas: Unremarkable.

Spleen: Borderline splenomegaly with a 6.5 cm maximal thickness.
Maximal span measures up to 13 cm.

Adrenals: Unremarkable.

Kidneys and ureters: No hydronephrosis or stone.

Bladder: Unremarkable.

Reproductive: Unremarkable.

Bowel: No obstruction. Normal appendix.

Retroperitoneum: No periaortic or preaortic lymphadenopathy. The
portacaval node is non enlarged at 11 mm short axis. Enlarged right
external iliac node measuring 14 mm short axis. Enlarged left
external iliac node at 11 x 30 mm. The bilateral inguinal nodes are
prominent, but not strictly enlarged.

Peritoneum: No free fluid or gas.

Vascular: No acute abnormality.

OSSEOUS: No acute abnormalities.
IMPRESSION: 1. Cervical, supraclavicular, and bilateral external iliac
lymphadenopathy compatible with history of CLL. The spleen is
borderline enlarged.
2. Cavitary nodes in the upper left cervical chain and left parotid
are unexpected in untreated lymphoma. Favor suppurative change
related to ipsilateral scalp cellulitis. Recommend skin exam to
ensure no evidence of squamous cell carcinoma in the left neck
drainage.

## 2019-01-19 ENCOUNTER — Emergency Department (HOSPITAL_COMMUNITY): Payer: Managed Care, Other (non HMO)

## 2019-01-19 ENCOUNTER — Encounter (HOSPITAL_COMMUNITY): Payer: Self-pay | Admitting: Emergency Medicine

## 2019-01-19 ENCOUNTER — Emergency Department (HOSPITAL_COMMUNITY)
Admission: EM | Admit: 2019-01-19 | Discharge: 2019-01-19 | Disposition: A | Discharge status: Home / Self Care | Payer: Managed Care, Other (non HMO) | Attending: Emergency Medicine | Admitting: Emergency Medicine

## 2019-01-19 DIAGNOSIS — Y829 Unspecified medical devices associated with adverse incidents: Secondary | ICD-10-CM | POA: Diagnosis not present

## 2019-01-19 DIAGNOSIS — C9110 Chronic lymphocytic leukemia of B-cell type not having achieved remission: Secondary | ICD-10-CM | POA: Diagnosis not present

## 2019-01-19 DIAGNOSIS — Z79899 Other long term (current) drug therapy: Secondary | ICD-10-CM | POA: Diagnosis not present

## 2019-01-19 DIAGNOSIS — R51 Headache: Secondary | ICD-10-CM | POA: Diagnosis present

## 2019-01-19 DIAGNOSIS — T50905A Adverse effect of unspecified drugs, medicaments and biological substances, initial encounter: Secondary | ICD-10-CM | POA: Diagnosis not present

## 2019-01-19 DIAGNOSIS — T887XXA Unspecified adverse effect of drug or medicament, initial encounter: Secondary | ICD-10-CM | POA: Diagnosis not present

## 2019-01-19 DIAGNOSIS — I10 Essential (primary) hypertension: Secondary | ICD-10-CM | POA: Insufficient documentation

## 2019-01-19 DIAGNOSIS — R112 Nausea with vomiting, unspecified: Secondary | ICD-10-CM | POA: Diagnosis not present

## 2019-01-19 LAB — CBC WITH DIFFERENTIAL/PLATELET
Abs Immature Granulocytes: 0.42 10*3/uL — ABNORMAL HIGH (ref 0.00–0.07)
Basophils Absolute: 0.1 10*3/uL (ref 0.0–0.1)
Basophils Relative: 0 %
Eosinophils Absolute: 0 10*3/uL (ref 0.0–0.5)
Eosinophils Relative: 0 %
HCT: 45.2 % (ref 39.0–52.0)
Hemoglobin: 13.8 g/dL (ref 13.0–17.0)
Immature Granulocytes: 0 %
Lymphocytes Relative: 94 %
Lymphs Abs: 109.6 10*3/uL — ABNORMAL HIGH (ref 0.7–4.0)
MCH: 30.8 pg (ref 26.0–34.0)
MCHC: 30.5 g/dL (ref 30.0–36.0)
MCV: 100.9 fL — ABNORMAL HIGH (ref 80.0–100.0)
Monocytes Absolute: 2.4 10*3/uL — ABNORMAL HIGH (ref 0.1–1.0)
Monocytes Relative: 2 %
Neutro Abs: 4.4 10*3/uL (ref 1.7–7.7)
Neutrophils Relative %: 4 %
Platelets: 201 10*3/uL (ref 150–400)
RBC: 4.48 MIL/uL (ref 4.22–5.81)
RDW: 16.6 % — ABNORMAL HIGH (ref 11.5–15.5)
WBC: 116.9 10*3/uL (ref 4.0–10.5)
nRBC: 0.1 % (ref 0.0–0.2)

## 2019-01-19 LAB — COMPREHENSIVE METABOLIC PANEL
ALT: 10 U/L (ref 0–44)
AST: 15 U/L (ref 15–41)
Albumin: 4.5 g/dL (ref 3.5–5.0)
Alkaline Phosphatase: 100 U/L (ref 38–126)
Anion gap: 9 (ref 5–15)
BUN: 19 mg/dL (ref 6–20)
CO2: 25 mmol/L (ref 22–32)
Calcium: 9.2 mg/dL (ref 8.9–10.3)
Chloride: 102 mmol/L (ref 98–111)
Creatinine, Ser: 0.88 mg/dL (ref 0.61–1.24)
GFR calc Af Amer: >60 mL/min (ref >60)
GFR calc non Af Amer: >60 mL/min (ref >60)
Glucose, Bld: 130 mg/dL — ABNORMAL HIGH (ref 70–99)
Potassium: 3.7 mmol/L (ref 3.5–5.1)
Sodium: 136 mmol/L (ref 135–145)
Total Bilirubin: 0.9 mg/dL (ref 0.3–1.2)
Total Protein: 7.8 g/dL (ref 6.5–8.1)

## 2019-01-19 LAB — URINALYSIS, ROUTINE W REFLEX MICROSCOPIC
Bacteria, UA: NONE SEEN
Bilirubin Urine: NEGATIVE
Glucose, UA: NEGATIVE mg/dL
Ketones, ur: NEGATIVE mg/dL
Leukocytes,Ua: NEGATIVE
Nitrite: NEGATIVE
Protein, ur: 30 mg/dL — AB
RBC / HPF: >50 RBC/hpf — ABNORMAL HIGH (ref 0–5)
Specific Gravity, Urine: 1.018 (ref 1.005–1.030)
pH: 6 (ref 5.0–8.0)

## 2019-01-19 MED ORDER — IOHEXOL 300 MG/ML  SOLN
100.0000 mL | Freq: Once | INTRAMUSCULAR | Status: AC | PRN
  Administered 2019-01-19: 21:00:00 100 mL via INTRAVENOUS

## 2019-01-19 MED ORDER — PROCHLORPERAZINE EDISYLATE 10 MG/2ML IJ SOLN
5.0000 mg | Freq: Once | INTRAMUSCULAR | Status: AC
  Administered 2019-01-19: 5 mg via INTRAVENOUS
  Filled 2019-01-19: qty 2

## 2019-01-19 MED ORDER — SODIUM CHLORIDE 0.9 % IV SOLN
1000.0000 mL | INTRAVENOUS | Status: DC
  Administered 2019-01-19: 20:00:00 1000 mL via INTRAVENOUS

## 2019-01-19 MED ORDER — PROMETHAZINE HCL 12.5 MG PO TABS: 12.5000 mg | ORAL_TABLET | Freq: Four times a day (QID) | ORAL | 0 refills | Status: DC | PRN

## 2019-01-19 MED ORDER — SODIUM CHLORIDE 0.9 % IV SOLN: 1000.0000 mL | INTRAVENOUS | Status: DC

## 2019-01-19 MED ORDER — SODIUM CHLORIDE 0.9 % IV BOLUS (SEPSIS)
1000.0000 mL | Freq: Once | INTRAVENOUS | Status: AC
  Administered 2019-01-19: 1000 mL via INTRAVENOUS

## 2019-01-19 MED ORDER — PROCHLORPERAZINE EDISYLATE 10 MG/2ML IJ SOLN
5.0000 mg | Freq: Once | INTRAMUSCULAR | Status: AC
  Administered 2019-01-19: 17:00:00 5 mg via INTRAVENOUS
  Filled 2019-01-19: qty 2

## 2019-01-19 NOTE — Discharge Instructions (Addendum)
Your CT head scan was negative.  You have received IV fluids and IV medicine for nausea.  It is quite possible that you had a adverse reaction to your pneumonia shot.  It is also of note that your white blood cells are 109,000.  Please see your hematology specialist so that this can be addressed.  Use promethazine every 6 hours if needed for nausea.  This medicine may cause drowsiness, please use it with caution.  Please increase fluids.  Please see your primary physician or return to the emergency department if your symptoms are not improving.

## 2019-01-19 NOTE — ED Notes (Signed)
Patient states nausea has subsided and headache has stopped.

## 2019-01-19 NOTE — ED Triage Notes (Signed)
Pt states that after he got a PCN shot at his doctor yesterday and started him back on bp medication he has been sick on his stomach and has a headache since.

## 2019-01-19 NOTE — ED Notes (Signed)
Pt received d/c papers. Did not sign did not want v/s retaken

## 2019-01-19 NOTE — ED Provider Notes (Signed)
[facility] EMERGENCY DEPARTMENT Provider Note   CSN: [id number] Arrival date & time: 01/19/19  1459     History   Chief Complaint Chief Complaint  Patient presents with  . Headache  . Nausea    HPI Bronson Bressman is a 58 y.o. male.     Patient is a 58 year old male who presents to the emergency department with a complaint of headache nausea and vomiting.  It is of note that the patient has lymphocytic leukemia.  The patient states that he received a pneumonia shot at his doctor's office on yesterday.  He says after receiving the shot he seemed to be more ill.  He complains of a headache that still is present.  He says he is tried Tylenol for this and it is not working.  The patient also complains of feeling nausea and feeling sick on his stomach he has had a few episodes of vomiting.  No fever reported.  No chills noted.  No recent diarrhea or constipation.  No changes in stool.  No recent changes in medication.  Patient presents now for assistance with this issue.  The history is provided by the patient.    Past Medical History:  Diagnosis Date  . CLL (chronic lymphocytic leukemia) ([place]) 09/11/2013  . Hypertension     Patient Active Problem List   Diagnosis Date Noted  . Lymphocytosis 09/11/2013  . CLL (chronic lymphocytic leukemia) ([place]) 09/11/2013  . Cellulitis of scalp 09/09/2013  . Cellulitis 09/09/2013    No past surgical history on file.      Home Medications    Prior to Admission medications   Medication Sig Start Date End Date Taking? Authorizing Provider  amLODipine (NORVASC) 10 MG tablet Take 1 tablet by mouth daily. 12/16/18  Yes [provider]  atorvastatin (LIPITOR) 20 MG tablet Take 1 tablet by mouth daily. 01/17/19  Yes [provider]  lisinopril (PRINIVIL,ZESTRIL) 20 MG tablet Take 20 mg by mouth daily.     Yes [provider]  naproxen sodium (ANAPROX) 220 MG tablet Take 220 mg by mouth 2 (two) times daily as needed  (pain).   Yes [provider]  sildenafil (REVATIO) 20 MG tablet Take 1 tablet by mouth daily. Prior to intercourse 07/27/18  Yes [provider]  acyclovir (ZOVIRAX) 200 MG capsule Take 1 capsule (200 mg total) by mouth 5 (five) times daily. 09/13/13   Samuella Cota, MD  HYDROcodone-acetaminophen (NORCO/VICODIN) 5-325 MG per tablet Take 1 tablet by mouth every 6 (six) hours as needed. 09/07/13   Carmin Muskrat, MD  mupirocin ointment (BACTROBAN) 2 % Apply 1 application topically daily. 09/02/13   [provider]  sulfamethoxazole-trimethoprim (BACTRIM DS) 800-160 MG per tablet Take 2 tablets by mouth every 12 (twelve) hours. 09/13/13   Samuella Cota, MD    Family History No family history on file.  Social History Social History   Tobacco Use  . Smoking status: Never Smoker  . Smokeless tobacco: Never Used  Substance Use Topics  . Alcohol use: No  . Drug use: No     Allergies   Patient has no known allergies.   Review of Systems Review of Systems  Constitutional: Positive for fatigue. Negative for activity change and appetite change.  HENT: Negative for congestion, ear discharge, ear pain, facial swelling, nosebleeds, rhinorrhea, sneezing and tinnitus.   Eyes: Negative for photophobia, pain and discharge.  Respiratory: Negative for cough, choking, shortness of breath and wheezing.   Cardiovascular: Negative for  chest pain, palpitations and leg swelling.  Gastrointestinal: Positive for nausea and vomiting. Negative for abdominal pain, blood in stool, constipation and diarrhea.  Genitourinary: Negative for difficulty urinating, dysuria, flank pain, frequency and hematuria.  Musculoskeletal: Positive for myalgias. Negative for back pain, gait problem and neck pain.  Skin: Negative for color change, rash and wound.  Neurological: Positive for headaches. Negative for dizziness, seizures, syncope, facial asymmetry, speech difficulty, weakness and  numbness.  Hematological: Negative for adenopathy. Does not bruise/bleed easily.  Psychiatric/Behavioral: Negative for agitation, confusion, hallucinations, self-injury and suicidal ideas. The patient is not nervous/anxious.      Physical Exam Updated Vital Signs BP (!) 171/103 (BP Location: Right Arm)   Pulse 72   Temp 98.4 F (36.9 C) (Oral)   Resp 18   Ht 5\' 11"  (1.803 m)   Wt 84.8 kg   SpO2 94%   BMI 26.08 kg/m   Physical Exam Vitals signs and nursing note reviewed.  Constitutional:      Appearance: He is well-developed. He is not toxic-appearing.  HENT:     Head: Normocephalic.     Right Ear: Tympanic membrane and external ear normal.     Left Ear: Tympanic membrane and external ear normal.  Eyes:     General: Lids are normal.     Pupils: Pupils are equal, round, and reactive to light.  Neck:     Musculoskeletal: Normal range of motion and neck supple.     Vascular: No carotid bruit.  Cardiovascular:     Rate and Rhythm: Normal rate and regular rhythm.     Pulses: Normal pulses.     Heart sounds: Normal heart sounds.  Pulmonary:     Effort: No respiratory distress.     Breath sounds: Normal breath sounds.  Abdominal:     General: Bowel sounds are normal.     Palpations: Abdomen is soft. There is no hepatomegaly, splenomegaly or mass.     Tenderness: There is no abdominal tenderness. There is no right CVA tenderness, left CVA tenderness or guarding. Negative signs include psoas sign.  Musculoskeletal: Normal range of motion.  Lymphadenopathy:     Head:     Right side of head: No submandibular adenopathy.     Left side of head: No submandibular adenopathy.     Cervical: No cervical adenopathy.  Skin:    General: Skin is warm and dry.  Neurological:     Mental Status: He is alert and oriented to person, place, and time.     Cranial Nerves: No cranial nerve deficit, dysarthria or facial asymmetry.     Sensory: No sensory deficit.     Coordination: Coordination  normal.     Gait: Gait normal.  Psychiatric:        Speech: Speech normal.      ED Treatments / Results  Labs (all labs ordered are listed, but only abnormal results are displayed) Labs Reviewed  COMPREHENSIVE METABOLIC PANEL  CBC WITH DIFFERENTIAL/PLATELET  URINALYSIS, ROUTINE W REFLEX MICROSCOPIC    EKG None  Radiology No results found.  Procedures Procedures (including critical care time)  Medications Ordered in ED Medications  sodium chloride 0.9 % bolus 1,000 mL (has no administration in time range)    Followed by  0.9 %  sodium chloride infusion (has no administration in time range)  prochlorperazine (COMPAZINE) injection 5 mg (has no administration in time range)     Initial Impression / Assessment and Plan / ED Course  I have  reviewed the triage vital signs and the nursing notes.  Pertinent labs & imaging results that were available during my care of the patient were reviewed by me and considered in my medical decision making (see chart for details).          Final Clinical Impressions(s) / ED Diagnoses MDM Blood pressure is elevated at 171/103, otherwise vital signs within normal limits.  Pulse oximetry is 94% on room air.  Within normal limits by my interpretation.  IV fluids and IV Compazine ordered.  6:15pm. Recheck. Pt reports the nausea has resolved. The headache is improving, but not resolved. I updated pt on CT results. UA pending.  7:17pm.  There was a delay in obtaining the urine, urine specimen has now been obtained.  The patient has resumed nausea and vomiting.  A second liter of fluid is being given.  A second dose of Compazine is also being given.  Recheck.  Patient feeling much better.  Headache improved.  No additional nausea or vomiting.  Patient ambulated in the room, and in the [place] without problem.  I discussed all the findings with the patient including the CT scan which shows no acute intra-abdominal or pelvic abnormality.  There  is some borderline to mild splenomegaly present.  I discussed with the patient the elevation in the white blood cell count of 116,000.  I have asked the patient to see his hematologist as soon as possible concerning this.  Patient was given medication to assist with his nausea should it return.  We also discussed the need for increasing liquids.  Patient is in agreement with this plan.  He will return if any worsening of symptoms, problems, or concerns.   Final diagnoses:  Non-intractable vomiting with nausea, unspecified vomiting type  CLL (chronic lymphocytic leukemia) ([place])  Non-dose-related adverse effect of medication, initial encounter    ED Discharge Orders         Ordered    promethazine (PHENERGAN) 12.5 MG tablet  Every 6 hours PRN     01/19/19 2309           Lily Kocher, PA-C 01/21/19 1048    Hayden Rasmussen, MD 01/21/19 1318

## 2019-01-19 NOTE — ED Notes (Signed)
Date and time results received: 01/19/19 5:46 PM  (use smartphrase ".now" to insert current time)  Test: WBC Critical Value: 116.9  Name of Provider Notified: Lily Kocher  Orders Received? Or Actions Taken?: Orders Received - See Orders for details

## 2019-01-21 LAB — URINE CULTURE: Culture: NO GROWTH

## 2019-02-01 ENCOUNTER — Emergency Department (HOSPITAL_COMMUNITY): Payer: Managed Care, Other (non HMO)

## 2019-02-01 ENCOUNTER — Encounter (HOSPITAL_COMMUNITY): Payer: Self-pay

## 2019-02-01 ENCOUNTER — Other Ambulatory Visit: Payer: Self-pay

## 2019-02-01 ENCOUNTER — Emergency Department (HOSPITAL_COMMUNITY)
Admission: EM | Admit: 2019-02-01 | Discharge: 2019-02-01 | Disposition: A | Discharge status: Home / Self Care | Payer: Managed Care, Other (non HMO) | Attending: Emergency Medicine | Admitting: Emergency Medicine

## 2019-02-01 DIAGNOSIS — R519 Headache, unspecified: Secondary | ICD-10-CM

## 2019-02-01 DIAGNOSIS — Z79899 Other long term (current) drug therapy: Secondary | ICD-10-CM | POA: Insufficient documentation

## 2019-02-01 DIAGNOSIS — Z856 Personal history of leukemia: Secondary | ICD-10-CM | POA: Diagnosis not present

## 2019-02-01 DIAGNOSIS — I10 Essential (primary) hypertension: Secondary | ICD-10-CM | POA: Diagnosis not present

## 2019-02-01 DIAGNOSIS — R319 Hematuria, unspecified: Secondary | ICD-10-CM | POA: Diagnosis not present

## 2019-02-01 DIAGNOSIS — R51 Headache: Secondary | ICD-10-CM | POA: Insufficient documentation

## 2019-02-01 LAB — BASIC METABOLIC PANEL
Anion gap: 16 — ABNORMAL HIGH (ref 5–15)
BUN: 17 mg/dL (ref 6–20)
CO2: 26 mmol/L (ref 22–32)
Calcium: 10.7 mg/dL — ABNORMAL HIGH (ref 8.9–10.3)
Chloride: 91 mmol/L — ABNORMAL LOW (ref 98–111)
Creatinine, Ser: 1.06 mg/dL (ref 0.61–1.24)
GFR calc Af Amer: >60 mL/min (ref >60)
GFR calc non Af Amer: >60 mL/min (ref >60)
Glucose, Bld: 149 mg/dL — ABNORMAL HIGH (ref 70–99)
Potassium: 5 mmol/L (ref 3.5–5.1)
Sodium: 133 mmol/L — ABNORMAL LOW (ref 135–145)

## 2019-02-01 LAB — CBC WITH DIFFERENTIAL/PLATELET
Abs Immature Granulocytes: 0 10*3/uL (ref 0.00–0.07)
Basophils Absolute: 0 10*3/uL (ref 0.0–0.1)
Basophils Relative: 0 %
Eosinophils Absolute: 1.5 10*3/uL — ABNORMAL HIGH (ref 0.0–0.5)
Eosinophils Relative: 1 %
HCT: 50.6 % (ref 39.0–52.0)
Hemoglobin: 15.8 g/dL (ref 13.0–17.0)
Lymphocytes Relative: 96 %
Lymphs Abs: 145.2 10*3/uL — ABNORMAL HIGH (ref 0.7–4.0)
MCH: 31 pg (ref 26.0–34.0)
MCHC: 31.2 g/dL (ref 30.0–36.0)
MCV: 99.4 fL (ref 80.0–100.0)
Monocytes Absolute: 1.5 10*3/uL — ABNORMAL HIGH (ref 0.1–1.0)
Monocytes Relative: 1 %
Neutro Abs: 3 10*3/uL (ref 1.7–7.7)
Neutrophils Relative %: 2 %
Platelets: 197 10*3/uL (ref 150–400)
RBC: 5.09 MIL/uL (ref 4.22–5.81)
RDW: 16.6 % — ABNORMAL HIGH (ref 11.5–15.5)
WBC Morphology: ABNORMAL
WBC: 151.3 10*3/uL (ref 4.0–10.5)
nRBC: 0 % (ref 0.0–0.2)

## 2019-02-01 LAB — TYPE AND SCREEN
ABO/RH(D): O NEG
Antibody Screen: NEGATIVE

## 2019-02-01 LAB — ABO/RH: ABO/RH(D): O NEG

## 2019-02-01 MED ORDER — GADOBUTROL 1 MMOL/ML IV SOLN
7.0000 mL | Freq: Once | INTRAVENOUS | Status: AC | PRN
  Administered 2019-02-01: 13:00:00 7 mL via INTRAVENOUS

## 2019-02-01 MED ORDER — DIPHENHYDRAMINE HCL 50 MG/ML IJ SOLN
12.5000 mg | Freq: Once | INTRAMUSCULAR | Status: AC
  Administered 2019-02-01: 11:00:00 12.5 mg via INTRAVENOUS
  Filled 2019-02-01: qty 1

## 2019-02-01 MED ORDER — METOCLOPRAMIDE HCL 5 MG/ML IJ SOLN
10.0000 mg | Freq: Once | INTRAMUSCULAR | Status: AC
  Administered 2019-02-01: 10 mg via INTRAVENOUS
  Filled 2019-02-01: qty 2

## 2019-02-01 MED ORDER — KETOROLAC TROMETHAMINE 15 MG/ML IJ SOLN
15.0000 mg | Freq: Once | INTRAMUSCULAR | Status: AC
  Administered 2019-02-01: 15 mg via INTRAVENOUS
  Filled 2019-02-01: qty 1

## 2019-02-01 MED ORDER — INDOMETHACIN 25 MG PO CAPS: 25.0000 mg | ORAL_CAPSULE | Freq: Three times a day (TID) | ORAL | 0 refills | Status: DC | PRN

## 2019-02-01 MED ORDER — SODIUM CHLORIDE 0.9 % IV SOLN
INTRAVENOUS | Status: DC
  Administered 2019-02-01: 13:00:00 via INTRAVENOUS

## 2019-02-01 NOTE — ED Triage Notes (Signed)
Pt states he's had a 10/10 headache in his entire headx2 wks. Pt states the posterior right side of his head feels numbx1.5 wks. Pt states when he "moves around a little bit" he gets light headed and blurred visionx1 wk. Pt states his legs feel weak, "where he can barely get one leg in front of another"x1.5 wks. Pt states this all started after he got the pneumococcal vaccine 3 wks ago. Pt is A&Ox4. Pt breathing is unlabored. Pt does not appear to be in any distress.

## 2019-02-01 NOTE — ED Provider Notes (Signed)
[facility] EMERGENCY DEPARTMENT Provider Note   CSN: [id number] Arrival date & time: 02/01/19  1002     History   Chief Complaint Chief Complaint  Patient presents with   Headache    HPI Thomas Bernard is a 58 y.o. male.     HPI   58 year old male with headache.  He reports onset about 2 weeks ago shortly after getting a pneumococcal vaccine.  He describes his headache is pretty constant.  Diffuse but such is that it feels a little bit worse in the frontal region and of the back of his head/scalp feels numb.  Also has some pain towards the left side of his neck.  He has been evaluated for this headache several times recently including in the emergency room and by his oncologist.  He had a CT scan a couple weeks ago which did not show any acute abnormality.  He has been taking tylenol, NSAIDs and Fioricet without improvement.  No fevers or chills.  No acute visual changes but does have some mild photophobia.  Denies n/v to me, but ED note from 01/19/19 mentions nausea and vomiting at that time.  No fevers.  No neck stiffness.  Doesn't feel confused. Legs feel weak which he describes further as that they feel heavy.   Past Medical History:  Diagnosis Date   CLL (chronic lymphocytic leukemia) ([place]) 09/11/2013   Hypertension     Patient Active Problem List   Diagnosis Date Noted   Lymphocytosis 09/11/2013   CLL (chronic lymphocytic leukemia) ([place]) 09/11/2013   Cellulitis of scalp 09/09/2013   Cellulitis 09/09/2013    History reviewed. No pertinent surgical history.      Home Medications    Prior to Admission medications   Medication Sig Start Date End Date Taking? Authorizing Provider  acyclovir (ZOVIRAX) 200 MG capsule Take 1 capsule (200 mg total) by mouth 5 (five) times daily. 09/13/13   Samuella Cota, MD  amLODipine (NORVASC) 10 MG tablet Take 1 tablet by mouth daily. 12/16/18   [provider]  atorvastatin (LIPITOR) 20 MG tablet Take  1 tablet by mouth daily. 01/17/19   [provider]  HYDROcodone-acetaminophen (NORCO/VICODIN) 5-325 MG per tablet Take 1 tablet by mouth every 6 (six) hours as needed. 09/07/13   Carmin Muskrat, MD  lisinopril (PRINIVIL,ZESTRIL) 20 MG tablet Take 20 mg by mouth daily.      [provider]  mupirocin ointment (BACTROBAN) 2 % Apply 1 application topically daily. 09/02/13   [provider]  naproxen sodium (ANAPROX) 220 MG tablet Take 220 mg by mouth 2 (two) times daily as needed (pain).    [provider]  promethazine (PHENERGAN) 12.5 MG tablet Take 1 tablet (12.5 mg total) by mouth every 6 (six) hours as needed. 01/19/19   Lily Kocher, PA-C  sildenafil (REVATIO) 20 MG tablet Take 1 tablet by mouth daily. Prior to intercourse 07/27/18   [provider]  sulfamethoxazole-trimethoprim (BACTRIM DS) 800-160 MG per tablet Take 2 tablets by mouth every 12 (twelve) hours. 09/13/13   Samuella Cota, MD    Family History No family history on file.  Social History Social History   Tobacco Use   Smoking status: Never Smoker   Smokeless tobacco: Never Used  Substance Use Topics   Alcohol use: No   Drug use: No     Allergies   Patient has no known allergies.   Review of Systems Review of Systems  All systems reviewed and negative,  other than as noted in HPI.  Physical Exam Updated Vital Signs BP (!) 135/93    Pulse 97    Temp 98.3 F (36.8 C) (Oral)    Resp 18    Ht 5\' 11"  (1.803 m)    Wt 79.4 kg    SpO2 96%    BMI 24.41 kg/m   Physical Exam Vitals signs and nursing note reviewed.  Constitutional:      General: He is not in acute distress.    Appearance: He is well-developed.  HENT:     Head: Normocephalic and atraumatic.  Eyes:     General:        Right eye: No discharge.        Left eye: No discharge.     Conjunctiva/sclera: Conjunctivae normal.  Neck:     Musculoskeletal: Neck supple.  Cardiovascular:     Rate and Rhythm:  Normal rate and regular rhythm.     Heart sounds: Normal heart sounds. No murmur. No friction rub. No gallop.   Pulmonary:     Effort: Pulmonary effort is normal. No respiratory distress.     Breath sounds: Normal breath sounds.  Abdominal:     General: There is no distension.     Palpations: Abdomen is soft.     Tenderness: There is no abdominal tenderness.  Musculoskeletal:        General: No tenderness.  Skin:    General: Skin is warm and dry.  Neurological:     Mental Status: He is alert and oriented to person, place, and time.     Cranial Nerves: No cranial nerve deficit.     Sensory: No sensory deficit.     Motor: No weakness.     Coordination: Coordination normal.  Psychiatric:        Behavior: Behavior normal.        Thought Content: Thought content normal.      ED Treatments / Results  Labs (all labs ordered are listed, but only abnormal results are displayed) Labs Reviewed  CBC WITH DIFFERENTIAL/PLATELET - Abnormal; Notable for the following components:      Result Value   WBC 151.3 (*)    RDW 16.6 (*)    Lymphs Abs 145.2 (*)    Monocytes Absolute 1.5 (*)    Eosinophils Absolute 1.5 (*)    All other components within normal limits  BASIC METABOLIC PANEL - Abnormal; Notable for the following components:   Sodium 133 (*)    Chloride 91 (*)    Glucose, Bld 149 (*)    Calcium 10.7 (*)    Anion gap 16 (*)    All other components within normal limits  PATHOLOGIST SMEAR REVIEW  TYPE AND SCREEN  ABO/RH    EKG None  Radiology No results found.   Ct Head Wo Contrast  Result Date: 01/19/2019 CLINICAL DATA:  Headache, nausea, vomiting, elevated blood pressure EXAM: CT HEAD WITHOUT CONTRAST TECHNIQUE: Contiguous axial images were obtained from the base of the skull through the vertex without intravenous contrast. COMPARISON:  09/11/2013 FINDINGS: Brain: No evidence of acute infarction, hemorrhage, hydrocephalus, extra-axial collection or mass lesion/mass effect.  Mild periventricular white matter hypodensity. Vascular: No hyperdense vessel or unexpected calcification. Skull: Normal. Negative for fracture or focal lesion. Sinuses/Orbits: No acute finding. Other: None. IMPRESSION: No acute intracranial pathology. Mild small-vessel white matter disease. No non-contrast CT findings to explain headache. Electronically Signed   By: Eddie Candle M.D.   On: 01/19/2019 17:39  Mr Angio Head Wo Contrast  Result Date: 02/01/2019 CLINICAL DATA:  New, persistent headache.  History of CLL. EXAM: MRI HEAD WITHOUT CONTRAST MRA HEAD WITHOUT CONTRAST MRV HEAD WITH AND WITHOUT CONTRAST TECHNIQUE: Multiplanar, multiecho pulse sequences of the brain and surrounding structures were obtained without intravenous contrast. Angiographic images of the head were obtained using MRA technique without contrast. Angiographic images of the intracranial venous structures were obtained using MRV technique without and with intravenous contrast. CONTRAST:  7 mL Gadovist IV COMPARISON:  CT head 01/19/2019 FINDINGS: MRI HEAD FINDINGS Brain: Ventricle size mildly prominent and unchanged from the prior study. Temporal horns mildly dilated. Negative for acute infarct. Scattered small periventricular and deep white matter hyperintensities, mild. Negative for hemorrhage or mass. Vascular: Normal arterial flow voids. Skull and upper cervical spine: Negative Sinuses/Orbits: Mild mucosal edema paranasal sinuses.  Normal orbit Other: None MRA HEAD FINDINGS Right vertebral dominant and patent to the basilar. Right PICA patent. Left vertebral not seen and may be occluded or hypoplastic. Basilar patent without stenosis. Superior cerebellar and posterior cerebral arteries patent bilaterally. Internal carotid artery patent bilaterally. Anterior and middle cerebral arteries widely patent bilaterally Negative for cerebral aneurysm. MRV HEAD FINDINGS Major dural sinuses are patent without stenosis or occlusion. Right  transverse sinus is dominant. Jugular vein patent bilaterally. IMPRESSION: 1. Mild ventricular enlargement unchanged from prior CT. Appearance consistent with mild hydrocephalus which could be a cause of headaches. 2. No acute infarct. Mild chronic white matter changes including periventricular white matter which could be due to chronic ischemia or demyelinating disease. 3. Distal left vertebral artery not visualized and may be occluded proximally or hypoplastic. Otherwise negative MRA head 4. No acute dural venous thrombosis or occlusion. Electronically Signed   By: Franchot Gallo M.D.   On: 02/01/2019 13:29   Mr Brain Wo Contrast  Result Date: 02/01/2019 CLINICAL DATA:  New, persistent headache.  History of CLL. EXAM: MRI HEAD WITHOUT CONTRAST MRA HEAD WITHOUT CONTRAST MRV HEAD WITH AND WITHOUT CONTRAST TECHNIQUE: Multiplanar, multiecho pulse sequences of the brain and surrounding structures were obtained without intravenous contrast. Angiographic images of the head were obtained using MRA technique without contrast. Angiographic images of the intracranial venous structures were obtained using MRV technique without and with intravenous contrast. CONTRAST:  7 mL Gadovist IV COMPARISON:  CT head 01/19/2019 FINDINGS: MRI HEAD FINDINGS Brain: Ventricle size mildly prominent and unchanged from the prior study. Temporal horns mildly dilated. Negative for acute infarct. Scattered small periventricular and deep white matter hyperintensities, mild. Negative for hemorrhage or mass. Vascular: Normal arterial flow voids. Skull and upper cervical spine: Negative Sinuses/Orbits: Mild mucosal edema paranasal sinuses.  Normal orbit Other: None MRA HEAD FINDINGS Right vertebral dominant and patent to the basilar. Right PICA patent. Left vertebral not seen and may be occluded or hypoplastic. Basilar patent without stenosis. Superior cerebellar and posterior cerebral arteries patent bilaterally. Internal carotid artery patent  bilaterally. Anterior and middle cerebral arteries widely patent bilaterally Negative for cerebral aneurysm. MRV HEAD FINDINGS Major dural sinuses are patent without stenosis or occlusion. Right transverse sinus is dominant. Jugular vein patent bilaterally. IMPRESSION: 1. Mild ventricular enlargement unchanged from prior CT. Appearance consistent with mild hydrocephalus which could be a cause of headaches. 2. No acute infarct. Mild chronic white matter changes including periventricular white matter which could be due to chronic ischemia or demyelinating disease. 3. Distal left vertebral artery not visualized and may be occluded proximally or hypoplastic. Otherwise negative MRA head 4. No acute dural venous  thrombosis or occlusion. Electronically Signed   By: Franchot Gallo M.D.   On: 02/01/2019 13:29   Ct Abdomen Pelvis W Contrast  Result Date: 01/19/2019 CLINICAL DATA:  Microscopic hematuria, nausea and vomiting EXAM: CT ABDOMEN AND PELVIS WITH CONTRAST TECHNIQUE: Multidetector CT imaging of the abdomen and pelvis was performed using the standard protocol following bolus administration of intravenous contrast. CONTRAST:  164mL OMNIPAQUE IOHEXOL 300 MG/ML  SOLN COMPARISON:  CT 09/12/2013 FINDINGS: Lower chest: Lung bases demonstrate no acute consolidation or effusion. Borderline to mild cardiomegaly. Small hiatal hernia Hepatobiliary: No focal liver abnormality is seen. No gallstones, gallbladder wall thickening, or biliary dilatation. Pancreas: Unremarkable. No pancreatic ductal dilatation or surrounding inflammatory changes. Spleen: Borderline to slightly enlarged Adrenals/Urinary Tract: Adrenal glands are normal. No hydronephrosis. Left parapelvic cyst. The bladder is unremarkable Stomach/Bowel: Stomach is within normal limits. Appendix appears normal. No evidence of bowel wall thickening, distention, or inflammatory changes. Vascular/Lymphatic: Nonaneurysmal aorta. Mild aortic atherosclerosis. Subcentimeter  retroperitoneal lymph nodes. Reproductive: Slightly enlarged prostate Other: Negative for free air or free fluid Musculoskeletal: No acute or suspicious osseous abnormality IMPRESSION: 1. No CT evidence for acute intra-abdominal or pelvic abnormality. 2. Borderline to mild splenomegaly Electronically Signed   By: Donavan Foil M.D.   On: 01/19/2019 21:04   Mr Mrv Head W Wo Contrast  Result Date: 02/01/2019 CLINICAL DATA:  New, persistent headache.  History of CLL. EXAM: MRI HEAD WITHOUT CONTRAST MRA HEAD WITHOUT CONTRAST MRV HEAD WITH AND WITHOUT CONTRAST TECHNIQUE: Multiplanar, multiecho pulse sequences of the brain and surrounding structures were obtained without intravenous contrast. Angiographic images of the head were obtained using MRA technique without contrast. Angiographic images of the intracranial venous structures were obtained using MRV technique without and with intravenous contrast. CONTRAST:  7 mL Gadovist IV COMPARISON:  CT head 01/19/2019 FINDINGS: MRI HEAD FINDINGS Brain: Ventricle size mildly prominent and unchanged from the prior study. Temporal horns mildly dilated. Negative for acute infarct. Scattered small periventricular and deep white matter hyperintensities, mild. Negative for hemorrhage or mass. Vascular: Normal arterial flow voids. Skull and upper cervical spine: Negative Sinuses/Orbits: Mild mucosal edema paranasal sinuses.  Normal orbit Other: None MRA HEAD FINDINGS Right vertebral dominant and patent to the basilar. Right PICA patent. Left vertebral not seen and may be occluded or hypoplastic. Basilar patent without stenosis. Superior cerebellar and posterior cerebral arteries patent bilaterally. Internal carotid artery patent bilaterally. Anterior and middle cerebral arteries widely patent bilaterally Negative for cerebral aneurysm. MRV HEAD FINDINGS Major dural sinuses are patent without stenosis or occlusion. Right transverse sinus is dominant. Jugular vein patent bilaterally.  IMPRESSION: 1. Mild ventricular enlargement unchanged from prior CT. Appearance consistent with mild hydrocephalus which could be a cause of headaches. 2. No acute infarct. Mild chronic white matter changes including periventricular white matter which could be due to chronic ischemia or demyelinating disease. 3. Distal left vertebral artery not visualized and may be occluded proximally or hypoplastic. Otherwise negative MRA head 4. No acute dural venous thrombosis or occlusion. Electronically Signed   By: Franchot Gallo M.D.   On: 02/01/2019 13:29    Procedures Procedures (including critical care time)  Medications Ordered in ED Medications  0.9 %  sodium chloride infusion (has no administration in time range)  metoCLOPramide (REGLAN) injection 10 mg (10 mg Intravenous Given 02/01/19 1101)  diphenhydrAMINE (BENADRYL) injection 12.5 mg (12.5 mg Intravenous Given 02/01/19 1102)     Initial Impression / Assessment and Plan / ED Course  I have reviewed the  triage vital signs and the nursing notes.  Pertinent labs & imaging results that were available during my care of the patient were reviewed by me and considered in my medical decision making (see chart for details).       58yM with gradual onset of headache a few weeks ago. Has been constant and progressive. Not responding well to medications. Hx of CLL. Recent labs showing progressive hyperleukocytosis 116,000 on 01/19/19 and 151,000 today. No focal neuro symptoms, afebrile and no respiratory complaints which is reassuring but I question if headaches may be from leukostasis.  Imaging as above.  I spoke with on-call oncologist at [facility] that with CLL specifically that he does not think that white count even in this range would be causing significant leukostasis and that it is not the etiology of his headaches.  We will have a patient follow-up with neurology.  Emergent return precautions were discussed.  Final Clinical Impressions(s) / ED  Diagnoses   Final diagnoses:  Intractable headache, unspecified chronicity pattern, unspecified headache type    ED Discharge Orders    None       Virgel Manifold, MD 02/05/19 1929

## 2019-02-01 NOTE — ED Notes (Signed)
Patient transported to MRI 

## 2019-02-01 NOTE — ED Notes (Signed)
Notified Dr. Wilson Singer of pt's critical WBC from lab

## 2019-02-01 NOTE — Discharge Instructions (Addendum)
Your MRI today doesn't show any acute abnormality. Your white blood cell count today is actually higher then it has been. I spoke with oncology at [facility] that is on call for Dr Cruzita Lederer. I discussed my concerns that your headaches may be related to how high your white blood cell count it. With your type of leukemia though, they think this is very unlikely.   I think the next step at this point is to see a neurologist. I am prescribing your something else to take for your headaches.

## 2019-02-04 LAB — PATHOLOGIST SMEAR REVIEW

## 2019-08-26 ENCOUNTER — Observation Stay (HOSPITAL_BASED_OUTPATIENT_CLINIC_OR_DEPARTMENT_OTHER): Payer: Managed Care, Other (non HMO)

## 2019-08-26 ENCOUNTER — Emergency Department (HOSPITAL_COMMUNITY): Payer: Managed Care, Other (non HMO)

## 2019-08-26 ENCOUNTER — Other Ambulatory Visit: Payer: Self-pay

## 2019-08-26 ENCOUNTER — Encounter (HOSPITAL_COMMUNITY): Payer: Self-pay | Admitting: Emergency Medicine

## 2019-08-26 ENCOUNTER — Observation Stay (HOSPITAL_COMMUNITY)
Admission: EM | Admit: 2019-08-26 | Discharge: 2019-08-27 | Disposition: A | Discharge status: Home / Self Care | Payer: Managed Care, Other (non HMO) | Attending: Internal Medicine | Admitting: Internal Medicine

## 2019-08-26 DIAGNOSIS — I361 Nonrheumatic tricuspid (valve) insufficiency: Secondary | ICD-10-CM

## 2019-08-26 DIAGNOSIS — E785 Hyperlipidemia, unspecified: Secondary | ICD-10-CM | POA: Insufficient documentation

## 2019-08-26 DIAGNOSIS — I340 Nonrheumatic mitral (valve) insufficiency: Secondary | ICD-10-CM | POA: Diagnosis not present

## 2019-08-26 DIAGNOSIS — Y990 Civilian activity done for income or pay: Secondary | ICD-10-CM | POA: Diagnosis not present

## 2019-08-26 DIAGNOSIS — S0181XA Laceration without foreign body of other part of head, initial encounter: Secondary | ICD-10-CM | POA: Diagnosis not present

## 2019-08-26 DIAGNOSIS — I452 Bifascicular block: Secondary | ICD-10-CM | POA: Insufficient documentation

## 2019-08-26 DIAGNOSIS — I4510 Unspecified right bundle-branch block: Secondary | ICD-10-CM | POA: Diagnosis not present

## 2019-08-26 DIAGNOSIS — Z79899 Other long term (current) drug therapy: Secondary | ICD-10-CM | POA: Diagnosis not present

## 2019-08-26 DIAGNOSIS — C9110 Chronic lymphocytic leukemia of B-cell type not having achieved remission: Secondary | ICD-10-CM | POA: Diagnosis present

## 2019-08-26 DIAGNOSIS — W19XXXA Unspecified fall, initial encounter: Secondary | ICD-10-CM | POA: Diagnosis not present

## 2019-08-26 DIAGNOSIS — J3489 Other specified disorders of nose and nasal sinuses: Secondary | ICD-10-CM | POA: Insufficient documentation

## 2019-08-26 DIAGNOSIS — I10 Essential (primary) hypertension: Secondary | ICD-10-CM | POA: Insufficient documentation

## 2019-08-26 DIAGNOSIS — R55 Syncope and collapse: Secondary | ICD-10-CM | POA: Diagnosis not present

## 2019-08-26 DIAGNOSIS — Z20822 Contact with and (suspected) exposure to covid-19: Secondary | ICD-10-CM | POA: Insufficient documentation

## 2019-08-26 LAB — CBC WITH DIFFERENTIAL/PLATELET
Abs Immature Granulocytes: 0.03 10*3/uL (ref 0.00–0.07)
Basophils Absolute: 0 10*3/uL (ref 0.0–0.1)
Basophils Relative: 0 %
Eosinophils Absolute: 0 10*3/uL (ref 0.0–0.5)
Eosinophils Relative: 0 %
HCT: 44.3 % (ref 39.0–52.0)
Hemoglobin: 14.6 g/dL (ref 13.0–17.0)
Immature Granulocytes: 0 %
Lymphocytes Relative: 12 %
Lymphs Abs: 1 10*3/uL (ref 0.7–4.0)
MCH: 30.2 pg (ref 26.0–34.0)
MCHC: 33 g/dL (ref 30.0–36.0)
MCV: 91.7 fL (ref 80.0–100.0)
Monocytes Absolute: 0.9 10*3/uL (ref 0.1–1.0)
Monocytes Relative: 11 %
Neutro Abs: 6.5 10*3/uL (ref 1.7–7.7)
Neutrophils Relative %: 77 %
Platelets: 216 10*3/uL (ref 150–400)
RBC: 4.83 MIL/uL (ref 4.22–5.81)
RDW: 16.6 % — ABNORMAL HIGH (ref 11.5–15.5)
WBC: 8.5 10*3/uL (ref 4.0–10.5)
nRBC: 0 % (ref 0.0–0.2)

## 2019-08-26 LAB — COMPREHENSIVE METABOLIC PANEL
ALT: 8 U/L (ref 0–44)
AST: 19 U/L (ref 15–41)
Albumin: 4.3 g/dL (ref 3.5–5.0)
Alkaline Phosphatase: 95 U/L (ref 38–126)
Anion gap: 11 (ref 5–15)
BUN: 19 mg/dL (ref 6–20)
CO2: 23 mmol/L (ref 22–32)
Calcium: 9.1 mg/dL (ref 8.9–10.3)
Chloride: 107 mmol/L (ref 98–111)
Creatinine, Ser: 0.88 mg/dL (ref 0.61–1.24)
GFR calc Af Amer: >60 mL/min (ref >60)
GFR calc non Af Amer: >60 mL/min (ref >60)
Glucose, Bld: 116 mg/dL — ABNORMAL HIGH (ref 70–99)
Potassium: 3.5 mmol/L (ref 3.5–5.1)
Sodium: 141 mmol/L (ref 135–145)
Total Bilirubin: 1 mg/dL (ref 0.3–1.2)
Total Protein: 6.9 g/dL (ref 6.5–8.1)

## 2019-08-26 LAB — TROPONIN I (HIGH SENSITIVITY)
Troponin I (High Sensitivity): 9 ng/L (ref <18)
Troponin I (High Sensitivity): 10 ng/L (ref <18)

## 2019-08-26 LAB — SARS CORONAVIRUS 2 (TAT 6-24 HRS): SARS Coronavirus 2: NEGATIVE

## 2019-08-26 LAB — ECHOCARDIOGRAM COMPLETE

## 2019-08-26 MED ORDER — SODIUM CHLORIDE 0.9 % IV SOLN: 250.0000 mL | INTRAVENOUS | Status: DC | PRN

## 2019-08-26 MED ORDER — ATORVASTATIN CALCIUM 20 MG PO TABS
20.0000 mg | ORAL_TABLET | Freq: Every day | ORAL | Status: DC
  Administered 2019-08-26: 20 mg via ORAL
  Filled 2019-08-26: qty 1

## 2019-08-26 MED ORDER — VENETOCLAX 100 MG PO TABS: 400.0000 mg | ORAL_TABLET | Freq: Every evening | ORAL | Status: DC

## 2019-08-26 MED ORDER — SODIUM CHLORIDE 0.9% FLUSH: 3.0000 mL | Freq: Two times a day (BID) | INTRAVENOUS | Status: DC

## 2019-08-26 MED ORDER — SODIUM CHLORIDE 0.9 % IV SOLN: INTRAVENOUS | Status: AC

## 2019-08-26 MED ORDER — ENOXAPARIN SODIUM 40 MG/0.4ML ~~LOC~~ SOLN
40.0000 mg | SUBCUTANEOUS | Status: DC
  Filled 2019-08-26: qty 0.4

## 2019-08-26 MED ORDER — ACETAMINOPHEN 650 MG RE SUPP: 650.0000 mg | Freq: Four times a day (QID) | RECTAL | Status: DC | PRN

## 2019-08-26 MED ORDER — ONDANSETRON HCL 4 MG/2ML IJ SOLN: 4.0000 mg | Freq: Four times a day (QID) | INTRAMUSCULAR | Status: DC | PRN

## 2019-08-26 MED ORDER — ONDANSETRON HCL 4 MG PO TABS: 4.0000 mg | ORAL_TABLET | Freq: Four times a day (QID) | ORAL | Status: DC | PRN

## 2019-08-26 MED ORDER — AMLODIPINE BESYLATE 10 MG PO TABS: 10.0000 mg | ORAL_TABLET | Freq: Every day | ORAL | Status: DC

## 2019-08-26 MED ORDER — ACETAMINOPHEN 325 MG PO TABS: 650.0000 mg | ORAL_TABLET | Freq: Four times a day (QID) | ORAL | Status: DC | PRN

## 2019-08-26 MED ORDER — SODIUM CHLORIDE 0.9% FLUSH: 3.0000 mL | INTRAVENOUS | Status: DC | PRN

## 2019-08-26 NOTE — ED Triage Notes (Signed)
Patient arriving by EMS w/ cc of unwitnessed syncope at work. Pt AOx4 w/ EMS arrival. Small laceration on forehead.   Pt currently being treated for leukemia.   BP 144/90 CBG 107 98% RA

## 2019-08-26 NOTE — ED Provider Notes (Signed)
[facility] DEPT Provider Note   CSN: [id number] Arrival date & time: 08/26/19  [id number]     History Chief Complaint  Patient presents with  . Loss of Consciousness    Thomas Bernard is a 59 y.o. male.  Patient brought in from work.  He had an unexplained syncopal episode.  It was unwitnessed.  He did strike his right forehead.  Has a skin tear there.  Tetanus is up-to-date.  Patient states he remembers feeling a little nauseated before otherwise he does not remember anything.  Blood sugar was 107 at the scene oxygen sats were 98% room air.  Patient has a history of CLL and is currently under chemotherapy treatment for that.  Also has a history of hypertension.  Patient denies any headache any visual changes any weakness or numbness any neck pain or back pain.  Patient states he feels fine now.        Past Medical History:  Diagnosis Date  . CLL (chronic lymphocytic leukemia) ([place]) 09/11/2013  . Hypertension     Patient Active Problem List   Diagnosis Date Noted  . Lymphocytosis 09/11/2013  . CLL (chronic lymphocytic leukemia) ([place]) 09/11/2013  . Cellulitis of scalp 09/09/2013  . Cellulitis 09/09/2013    History reviewed. No pertinent surgical history.     History reviewed. No pertinent family history.  Social History   Tobacco Use  . Smoking status: Never Smoker  . Smokeless tobacco: Never Used  Substance Use Topics  . Alcohol use: No  . Drug use: No    Home Medications Prior to Admission medications   Medication Sig Start Date End Date Taking? Authorizing Provider  acetaminophen (TYLENOL) 500 MG tablet Take 1,000 mg by mouth every 6 (six) hours as needed.   Yes [provider]  amLODipine (NORVASC) 10 MG tablet Take 10 mg by mouth daily.  12/16/18  Yes [provider]  atorvastatin (LIPITOR) 20 MG tablet Take 20 mg by mouth daily.  01/17/19  Yes [provider]  venetoclax 100 MG TABS Take 400 mg by mouth  every evening.  07/30/19  Yes [provider]  acyclovir (ZOVIRAX) 200 MG capsule Take 1 capsule (200 mg total) by mouth 5 (five) times daily. Patient not taking: Reported on 02/01/2019 09/13/13   Samuella Cota, MD  HYDROcodone-acetaminophen (NORCO/VICODIN) 5-325 MG per tablet Take 1 tablet by mouth every 6 (six) hours as needed. Patient not taking: Reported on 02/01/2019 09/07/13   Carmin Muskrat, MD  indomethacin (INDOCIN) 25 MG capsule Take 1 capsule (25 mg total) by mouth 3 (three) times daily as needed. Patient not taking: Reported on 08/26/2019 02/01/19   Virgel Manifold, MD  promethazine (PHENERGAN) 12.5 MG tablet Take 1 tablet (12.5 mg total) by mouth every 6 (six) hours as needed. Patient not taking: Reported on 08/26/2019 01/19/19   Lily Kocher, PA-C  sulfamethoxazole-trimethoprim (BACTRIM DS) 800-160 MG per tablet Take 2 tablets by mouth every 12 (twelve) hours. Patient not taking: Reported on 02/01/2019 09/13/13   Samuella Cota, MD    Allergies    Patient has no known allergies.  Review of Systems   Review of Systems  Constitutional: Negative for chills and fever.  HENT: Negative for congestion, rhinorrhea and sore throat.   Eyes: Negative for visual disturbance.  Respiratory: Negative for cough and shortness of breath.   Cardiovascular: Negative for chest pain and leg swelling.  Gastrointestinal: Positive for nausea. Negative for abdominal pain, diarrhea and vomiting.  Genitourinary: Negative  for dysuria.  Musculoskeletal: Negative for back pain and neck pain.  Skin: Negative for rash.  Neurological: Positive for syncope. Negative for dizziness, light-headedness and headaches.  Hematological: Does not bruise/bleed easily.  Psychiatric/Behavioral: Negative for confusion.    Physical Exam Updated Vital Signs BP 124/83   Pulse 66   Temp 98 F (36.7 C) (Oral)   Resp 13   SpO2 95%   Physical Exam Vitals and nursing note reviewed.  Constitutional:      General:  He is not in acute distress.    Appearance: Normal appearance. He is well-developed.  HENT:     Head: Normocephalic.     Comments: 1 cm elliptical skin tear right forehead. Eyes:     Extraocular Movements: Extraocular movements intact.     Conjunctiva/sclera: Conjunctivae normal.     Pupils: Pupils are equal, round, and reactive to light.  Cardiovascular:     Rate and Rhythm: Normal rate and regular rhythm.     Heart sounds: No murmur.  Pulmonary:     Effort: Pulmonary effort is normal. No respiratory distress.     Breath sounds: Normal breath sounds.  Abdominal:     Palpations: Abdomen is soft.     Tenderness: There is no abdominal tenderness.  Musculoskeletal:        General: No swelling. Normal range of motion.     Cervical back: Normal range of motion and neck supple.  Skin:    General: Skin is warm and dry.     Capillary Refill: Capillary refill takes less than 2 seconds.  Neurological:     General: No focal deficit present.     Mental Status: He is alert and oriented to person, place, and time.     Cranial Nerves: No cranial nerve deficit.     Sensory: No sensory deficit.     Motor: No weakness.     ED Results / Procedures / Treatments   Labs (all labs ordered are listed, but only abnormal results are displayed) Labs Reviewed  CBC WITH DIFFERENTIAL/PLATELET - Abnormal; Notable for the following components:      Result Value   RDW 16.6 (*)    All other components within normal limits  COMPREHENSIVE METABOLIC PANEL - Abnormal; Notable for the following components:   Glucose, Bld 116 (*)    All other components within normal limits  TROPONIN I (HIGH SENSITIVITY)  TROPONIN I (HIGH SENSITIVITY)   Results for orders placed or performed during the hospital encounter of 08/26/19  CBC with Differential/Platelet  Result Value Ref Range   WBC 8.5 4.0 - 10.5 K/uL   RBC 4.83 4.22 - 5.81 MIL/uL   Hemoglobin 14.6 13.0 - 17.0 g/dL   HCT 44.3 39.0 - 52.0 %   MCV 91.7 80.0 -  100.0 fL   MCH 30.2 26.0 - 34.0 pg   MCHC 33.0 30.0 - 36.0 g/dL   RDW 16.6 (H) 11.5 - 15.5 %   Platelets 216 150 - 400 K/uL   nRBC 0.0 0.0 - 0.2 %   Neutrophils Relative % 77 %   Neutro Abs 6.5 1.7 - 7.7 K/uL   Lymphocytes Relative 12 %   Lymphs Abs 1.0 0.7 - 4.0 K/uL   Monocytes Relative 11 %   Monocytes Absolute 0.9 0.1 - 1.0 K/uL   Eosinophils Relative 0 %   Eosinophils Absolute 0.0 0.0 - 0.5 K/uL   Basophils Relative 0 %   Basophils Absolute 0.0 0.0 - 0.1 K/uL   Immature Granulocytes  0 %   Abs Immature Granulocytes 0.03 0.00 - 0.07 K/uL  Comprehensive metabolic panel  Result Value Ref Range   Sodium 141 135 - 145 mmol/L   Potassium 3.5 3.5 - 5.1 mmol/L   Chloride 107 98 - 111 mmol/L   CO2 23 22 - 32 mmol/L   Glucose, Bld 116 (H) 70 - 99 mg/dL   BUN 19 6 - 20 mg/dL   Creatinine, Ser 0.88 0.61 - 1.24 mg/dL   Calcium 9.1 8.9 - 10.3 mg/dL   Total Protein 6.9 6.5 - 8.1 g/dL   Albumin 4.3 3.5 - 5.0 g/dL   AST 19 15 - 41 U/L   ALT 8 0 - 44 U/L   Alkaline Phosphatase 95 38 - 126 U/L   Total Bilirubin 1.0 0.3 - 1.2 mg/dL   GFR calc non Af Amer >60 >60 mL/min   GFR calc Af Amer >60 >60 mL/min   Anion gap 11 5 - 15  Troponin I (High Sensitivity)  Result Value Ref Range   Troponin I (High Sensitivity) 9 <18 ng/L     EKG EKG Interpretation  Date/Time:  Tuesday August 26 2019 07:21:02 EST Ventricular Rate:  59 PR Interval:    QRS Duration: 138 QT Interval:  432 QTC Calculation: 428 R Axis:   -47 Text Interpretation: Sinus rhythm RBBB and LAFB Left ventricular hypertrophy No previous ECGs available Confirmed by Fredia Sorrow [phone]) on 08/26/2019 8:06:37 AM    ED ECG REPORT   Date: 08/26/2019  Rate: 59  Rhythm: normal sinus rhythm  QRS Axis: left  Intervals: normal  ST/T Wave abnormalities: nonspecific T wave changes  Conduction Disutrbances:right bundle branch block and left anterior fascicular block  Narrative Interpretation:   Old EKG Reviewed: none  available  I have personally reviewed the EKG tracing and agree with the computerized printout as noted.   Radiology CT Head Wo Contrast  Result Date: 08/26/2019 CLINICAL DATA:  Head trauma, moderate/severe. Additional history provided: Unwitnessed syncope at work, small laceration to forehead, patient reports undergoing current treatment for leukemia. EXAM: CT HEAD WITHOUT CONTRAST TECHNIQUE: Contiguous axial images were obtained from the base of the skull through the vertex without intravenous contrast. COMPARISON:  MRI brain 02/01/2019. FINDINGS: Brain: There is no evidence of acute intracranial hemorrhage, intracranial mass, midline shift or extra-axial fluid collection.No demarcated cortical infarction. Cerebral volume is normal for age. Vascular: No hyperdense vessel. Skull: Normal. Negative for fracture or focal lesion. Sinuses/Orbits: Visualized orbits demonstrate no acute abnormality. Mild mucosal thickening greatest within the partially imaged right maxillary sinus. No significant mastoid effusion. IMPRESSION: No evidence of acute intracranial abnormality. Mild paranasal sinus mucosal thickening. Electronically Signed   By: Kellie Simmering DO   On: 08/26/2019 09:41   DG Chest Port 1 View  Result Date: 08/26/2019 CLINICAL DATA:  Syncope EXAM: PORTABLE CHEST 1 VIEW COMPARISON:  02/14/2019 FINDINGS: The heart size and mediastinal contours are stable. No focal airspace consolidation, pleural effusion, or pneumothorax. The visualized skeletal structures are unremarkable. IMPRESSION: No active disease. Electronically Signed   By: Davina Poke D.O.   On: 08/26/2019 08:16    Procedures Procedures (including critical care time)  Medications Ordered in ED Medications - No data to display  ED Course  I have reviewed the triage vital signs and the nursing notes.  Pertinent labs & imaging results that were available during my care of the patient were reviewed by me and considered in my medical  decision making (see chart for details).  MDM Rules/Calculators/A&P                      Patient with unexplained syncopal episode.  He does relate that he felt a little bit nauseated right before.  But he has not vomited.  Patient has a skin tear to his right forehead area where he went down.  Patient has a history of CLL which is currently under chemo pill treatment.  Patient's labs here head CT chest x-ray without any acute findings.  EKG and we did not have any old ones on record showed bundle branch block pattern so troponin was done.  Troponin came in at 9 not significantly elevated.  Patient's electrolytes without any significant abnormality including liver function test.  And chest x-ray without acute findings.  Patient's vital signs no hypoxia no fever not tachycardic.   Final Clinical Impression(s) / ED Diagnoses Final diagnoses:  None    Rx / DC Orders ED Discharge Orders    None       Fredia Sorrow, MD 08/26/19 [phone]

## 2019-08-26 NOTE — Progress Notes (Signed)
  Echocardiogram 2D Echocardiogram has been performed.  Nevena Rozenberg A Karam Dunson 08/26/2019, 11:56 AM

## 2019-08-26 NOTE — H&P (Addendum)
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  He hit his head on the floor. 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  Vital stable 118/74, HR 76  RR 16.  NO recent f/c/s, anosmia/ loss of taste, cough / SOB, myalgias.  COVID test is pending. SpO2 97% on room air.  Asked to see for admisison.   Patient has hx HTN. Also hx of CLL dx'd in 2011 w/ wbc 19k, ALC 13K . He was lost to f/u until 2015 when WBC was up to 33k, ALC 28k. He is f/b ONC at [facility] and also Dr Cruzita Lederer in [facility]. Pt was admitted to [facility] for 8 days in Aug 2020 w/ HA and visual loss. LP showed large WBC ct 3465 and pt was dx'd w/ CLL infiltrating CVS. Pt was rx'd w/ diamox and serial LP's to lower CSF volume, also 4d of IV decadron.  Pt's symptoms improved and pt was dc'd. Dr Linus Orn was assigned to take over pt's CLL care for [facility], and pt was to get chemoRx w/ Dr Cruzita Lederer in [facility] started in Sept 2020 w/ obinutuzumab every 3 wks cycle for 6 mos and po venetoclax 400mg  qd for 12 mos.   Pt last seen by PCP dr Mariel Kansky in [place] on 08/07/19 3 wks ago. wBC was 3.4, Hb 15, plt 226.   Last seen in [place] by Dr Linus Orn on 07/31/19 at [facility] who noted no new sx's and HA's resolved. Pt had completed 5 rds of chemo at that visit.  Last seen by Dr Cruzita Lederer on 07/30/19 w/ no new problems. Had neutropenia which resolved w/ temp holding of venetoclax dosing.    Past Medical History:  Diagnosis Date  . CLL (chronic lymphocytic leukemia) ([place]) 09/11/2013  . Hypertension     History reviewed. No pertinent surgical history.  History reviewed. No pertinent family history. Social History:  reports that he has never smoked.  He has never used smokeless tobacco. He reports that he does not drink alcohol or use drugs.  Allergies: No Known Allergies  (Not in a hospital admission)   Results for orders placed or performed during the hospital encounter of 08/26/19 (from the past 48 hour(s))  CBC with Differential/Platelet     Status: Abnormal   Collection Time: 08/26/19  7:31 AM  Result Value Ref Range   WBC 8.5 4.0 - 10.5 K/uL   RBC 4.83 4.22 - 5.81 MIL/uL   Hemoglobin 14.6 13.0 - 17.0 g/dL   HCT 44.3 39.0 - 52.0 %   MCV 91.7 80.0 - 100.0 fL   MCH 30.2 26.0 - 34.0 pg   MCHC 33.0 30.0 - 36.0 g/dL   RDW 16.6 (H) 11.5 - 15.5 %   Platelets 216 150 - 400 K/uL   nRBC 0.0 0.0 - 0.2 %   Neutrophils Relative % 77 %   Neutro Abs 6.5 1.7 - 7.7 K/uL   Lymphocytes Relative 12 %   Lymphs Abs 1.0 0.7 - 4.0 K/uL   Monocytes Relative 11 %   Monocytes Absolute 0.9 0.1 - 1.0 K/uL   Eosinophils Relative 0 %  Eosinophils Absolute 0.0 0.0 - 0.5 K/uL   Basophils Relative 0 %   Basophils Absolute 0.0 0.0 - 0.1 K/uL   Immature Granulocytes 0 %   Abs Immature Granulocytes 0.03 0.00 - 0.07 K/uL    Comment: Performed at [facility], [place] [address], [place], [place] [address]  Comprehensive metabolic panel     Status: Abnormal   Collection Time: 08/26/19  7:31 AM  Result Value Ref Range   Sodium 141 135 - 145 mmol/L   Potassium 3.5 3.5 - 5.1 mmol/L   Chloride 107 98 - 111 mmol/L   CO2 23 22 - 32 mmol/L   Glucose, Bld 116 (H) 70 - 99 mg/dL    Comment: Glucose reference range applies only to samples taken after fasting for at least 8 hours.   BUN 19 6 - 20 mg/dL   Creatinine, Ser 0.88 0.61 - 1.24 mg/dL   Calcium 9.1 8.9 - 10.3 mg/dL   Total Protein 6.9 6.5 - 8.1 g/dL   Albumin 4.3 3.5 - 5.0 g/dL   AST 19 15 - 41 U/L   ALT 8 0 - 44 U/L   Alkaline Phosphatase 95 38 - 126 U/L   Total Bilirubin 1.0 0.3 - 1.2 mg/dL   GFR calc non Af Amer >60 >60 mL/min   GFR calc Af Amer >60 >60 mL/min   Anion gap 11 5  - 15    Comment: Performed at [facility], [place] [address] [place], [place] [address]  Troponin I (High Sensitivity)     Status: None   Collection Time: 08/26/19  7:31 AM  Result Value Ref Range   Troponin I (High Sensitivity) 9 <18 ng/L    Comment: (NOTE) Elevated high sensitivity troponin I (hsTnI) values and significant  changes across serial measurements may suggest ACS but many other  chronic and acute conditions are known to elevate hsTnI results.  Refer to the "Links" section for chest pain algorithms and additional  guidance. Performed at [facility], [place] [address], [place], [place] [address]    CT Head Wo Contrast  Result Date: 08/26/2019 CLINICAL DATA:  Head trauma, moderate/severe. Additional history provided: Unwitnessed syncope at work, small laceration to forehead, patient reports undergoing current treatment for leukemia. EXAM: CT HEAD WITHOUT CONTRAST TECHNIQUE: Contiguous axial images were obtained from the base of the skull through the vertex without intravenous contrast. COMPARISON:  MRI brain 02/01/2019. FINDINGS: Brain: There is no evidence of acute intracranial hemorrhage, intracranial mass, midline shift or extra-axial fluid collection.No demarcated cortical infarction. Cerebral volume is normal for age. Vascular: No hyperdense vessel. Skull: Normal. Negative for fracture or focal lesion. Sinuses/Orbits: Visualized orbits demonstrate no acute abnormality. Mild mucosal thickening greatest within the partially imaged right maxillary sinus. No significant mastoid effusion. IMPRESSION: No evidence of acute intracranial abnormality. Mild paranasal sinus mucosal thickening. Electronically Signed   By: Kellie Simmering DO   On: 08/26/2019 09:41   DG Chest Port 1 View  Result Date: 08/26/2019 CLINICAL DATA:  Syncope EXAM: PORTABLE CHEST 1 VIEW COMPARISON:  02/14/2019 FINDINGS: The heart size and mediastinal contours are stable. No focal airspace  consolidation, pleural effusion, or pneumothorax. The visualized skeletal structures are unremarkable. IMPRESSION: No active disease. Electronically Signed   By: Davina Poke D.O.   On: 08/26/2019 08:16    Review of Systems No HA or visual loss  no CP, SOB  no abd pain   no dysuria, voiding issues  no constipation  No nausea vomiting    No joint pain or skin rash   No hx passing out  Blood pressure 122/88, pulse 67, temperature 98 F (36.7 C), temperature source Oral, resp. rate 14, SpO2 97 %. Physical Exam  Gen alert, no distress No rash, cyanosis or gangrene Sclera anicteric, throat clear  No jvd or bruits Chest clear bilat to bases RRR no MRG Abd soft ntnd no mass or ascites +bs GU normal male MS no joint effusions or deformity Ext no LE or UE edema, no wounds or ulcers Neuro is alert, Ox 3 , nf CN's 2-12 grossly intact, strength 5/5 in ext x 4 Sensation intact x 4 ext. Gait not tested.      Assessment/ Plan: 1. Syncope - 1st episode, occurred at work.  W/U so far negative w/ stable VS, normal head CT and normal labs. EKG does have bifascicular block but no signs of heart block on telemetry so far.  Will plan admit for observation, telemetry, echo, orthostatics. Give 2 L IVF overnight empirically. F/u labs in am.   2. Hx CLL dx'd 2011, never treated until admit Aug 2020 at [facility] for CNS involvement w/ HA/ visual loss. Was treated w/ decadron and serial LP and resolved. Started on chemoRx and has had close to 6 mos of the planned IV chemo and has 6 more mos of po venetoclax.  F/b ONC in [employer] and also at [employer].  WBC / lymph count here is normal.  3. HTN - essential, on norvasc 4. HL - takes lipitor       Kelly Splinter, MD   [facility] 08/26/2019, 10:25 AM

## 2019-08-27 DIAGNOSIS — R55 Syncope and collapse: Secondary | ICD-10-CM

## 2019-08-27 LAB — HIV ANTIBODY (ROUTINE TESTING W REFLEX): HIV Screen 4th Generation wRfx: NONREACTIVE

## 2019-08-27 LAB — GLUCOSE, CAPILLARY: Glucose-Capillary: 90 mg/dL (ref 70–99)

## 2019-08-27 NOTE — Discharge Summary (Signed)
Physician Discharge Summary  Thomas Bernard [id number] DOB: 02/10/1961   PCP: Robert Bellow, PA-C  Admit date: 08/26/2019 Discharge date: 08/27/2019 Length of Stay: 0 days   Code Status: Full Code  Admitted From:  Home Discharged to:   [place]:  None  Equipment/Devices:  None Discharge Condition:  Stable  Recommendations for Outpatient Follow-up   1. 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  Consider outpatient work-up  Hospital Summary  Thomas Bernard is a 59 y.o. male with a history of hypertension, CLL on venetoclax who presented on 3/2 for syncopal event while at work at rest was admitted for complete work-up. 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  He hit his head on the floor.  Did not have postictal symptoms.  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    ED vitals stable 118/74, HR 76  RR 16.  No recent f/c/s, anosmia/ loss of taste, cough / SOB, myalgias.  COVID test is negative. SpO2 97% on room air.    Had TTE which was largely unremarkable, EF 60 to 65% but did show myxomatous mitral and tricuspid valves.  Unlikely to have been related to patient's syncopal episode.  Patient had significant improvement and resolution of symptoms without any recurrence.  Syncopal episode likely secondary to vasovagal etiology.  A & P   Principal Problem:   Syncope Active Problems:   CLL (chronic lymphocytic leukemia) (HCC)   Essential hypertension   1. Vasovagal syncope CT head without acute findings, unremarkable labs.  EKG with bifascicular block which has been seen on EKGs in care everywhere and seems this is patient's baseline.  Echo unremarkable for etiology of syncope.  Prodrome of flushing/nausea/diaphoresis  followed by LOC, postictal episode likely vasovagal in nature. 1. Outpatient follow-up 2. CLL on venetoclax stable 3. Hypertension on Norvasc 4. Hyperlipidemia on Lipitor    Consultants  . None  Procedures  . None  Antibiotics   Anti-infectives (From admission, onward)   None       Subjective  Patient seen and examined at bedside no acute distress and resting comfortably.  No events overnight.  Tolerating diet. In good spirits and anticipating discharge.  Ambulating well per nursing  Denies any chest pain, shortness of breath, fever, nausea, vomiting, urinary or bowel complaints. Otherwise ROS negative    Objective   Discharge Exam: Vitals:   08/26/19 2042 08/27/19 0520  BP: 132/78 117/84  Pulse: 74 (!) 57  Resp: 20 20  Temp: 98.6 F (37 C) 98.3 F (36.8 C)  SpO2: 96% 95%   Vitals:   08/26/19 1257 08/26/19 1330 08/26/19 2042 08/27/19 0520  BP:  137/85 132/78 117/84  Pulse:  67 74 (!) 57  Resp:  18 20 20   Temp:  97.7 F (36.5 C) 98.6 F (37 C) 98.3 F (36.8 C)  TempSrc:  Oral Oral Oral  SpO2:  99% 96% 95%  Weight: 81.4 kg   82.7 kg  Height: 6' (1.829 m)       Physical Exam Vitals and nursing note reviewed.  Constitutional:      Appearance: Normal appearance.  HENT:     Head: Normocephalic and atraumatic.  Eyes:     Conjunctiva/sclera: Conjunctivae normal.  Cardiovascular:     Rate and Rhythm: Normal rate and regular rhythm.  Pulmonary:     Effort: Pulmonary effort is normal.     Breath sounds: Normal breath sounds.  Abdominal:     General: Abdomen is flat.     Palpations: Abdomen is soft.  Musculoskeletal:        General: No swelling or tenderness.  Skin:    Coloration: Skin is not jaundiced or pale.  Neurological:     Mental Status: He is alert. Mental status is at baseline.  Psychiatric:        Mood and Affect: Mood normal.        Behavior: Behavior normal.       The results of significant diagnostics from this hospitalization  (including imaging, microbiology, ancillary and laboratory) are listed below for reference.     Microbiology: Recent Results (from the past 240 hour(s))  SARS CORONAVIRUS 2 (TAT 6-24 HRS) Nasopharyngeal Nasopharyngeal Swab     Status: None   Collection Time: 08/26/19 10:07 AM   Specimen: Nasopharyngeal Swab  Result Value Ref Range Status   SARS Coronavirus 2 NEGATIVE NEGATIVE Final    Comment: (NOTE) SARS-CoV-2 target nucleic acids are NOT DETECTED. The SARS-CoV-2 RNA is generally detectable in upper and lower respiratory specimens during the acute phase of infection. Negative results do not preclude SARS-CoV-2 infection, do not rule out co-infections with other pathogens, and should not be used as the sole basis for treatment or other patient management decisions. Negative results must be combined with clinical observations, patient history, and epidemiological information. The expected result is Negative. Fact Sheet for Patients: SugarRoll.be Fact Sheet for Healthcare Providers: https://www.woods-mathews.com/ This test is not yet approved or cleared by the [place] FDA and  has been authorized for detection and/or diagnosis of SARS-CoV-2 by FDA under an Emergency Use Authorization (EUA). This EUA will remain  in effect (meaning this test can be used) for the duration of the COVID-19 declaration under Section 56 4(b)(1) of the Act, 21 U.S.C. section 360bbb-3(b)(1), unless the authorization is terminated or revoked sooner. Performed at [facility], [place] [address] [place], [place] [address]      Labs: BNP (last 3 results) No results for input(s): BNP in the last 8760 hours. Basic Metabolic Panel: Recent Labs  Lab 08/26/19 0731  NA 141  K 3.5  CL 107  CO2 23  GLUCOSE 116*  BUN 19  CREATININE 0.88  CALCIUM 9.1   Liver Function Tests: Recent Labs  Lab 08/26/19 0731  AST 19  ALT 8  ALKPHOS 95  BILITOT 1.0   PROT 6.9  ALBUMIN 4.3   No results for input(s): LIPASE, AMYLASE in the last 168 hours. No results for input(s): AMMONIA in the last 168 hours. CBC: Recent Labs  Lab 08/26/19 0731  WBC 8.5  NEUTROABS 6.5  HGB 14.6  HCT 44.3  MCV 91.7  PLT 216   Cardiac Enzymes: No results for input(s): CKTOTAL, CKMB, CKMBINDEX, TROPONINI in the last 168 hours. BNP: Invalid input(s): POCBNP CBG: Recent Labs  Lab 08/27/19 0714  GLUCAP 90   D-Dimer No results for input(s): DDIMER in the last 72 hours. Hgb A1c No results for input(s): HGBA1C in the last 72 hours. Lipid Profile No results for input(s): CHOL, HDL, LDLCALC, TRIG, CHOLHDL, LDLDIRECT in the last 72 hours. Thyroid function studies No results for input(s): TSH, T4TOTAL, T3FREE, THYROIDAB in the last 72 hours.  Invalid input(s): FREET3 Anemia work up No results  for input(s): VITAMINB12, FOLATE, FERRITIN, TIBC, IRON, RETICCTPCT in the last 72 hours. Urinalysis    Component Value Date/Time   COLORURINE YELLOW 01/19/2019 1900   APPEARANCEUR CLEAR 01/19/2019 1900   LABSPEC 1.018 01/19/2019 1900   PHURINE 6.0 01/19/2019 1900   GLUCOSEU NEGATIVE 01/19/2019 1900   HGBUR MODERATE (A) 01/19/2019 1900   BILIRUBINUR NEGATIVE 01/19/2019 1900   KETONESUR NEGATIVE 01/19/2019 1900   PROTEINUR 30 (A) 01/19/2019 1900   NITRITE NEGATIVE 01/19/2019 1900   LEUKOCYTESUR NEGATIVE 01/19/2019 1900   Sepsis Labs Invalid input(s): PROCALCITONIN,  WBC,  LACTICIDVEN Microbiology Recent Results (from the past 240 hour(s))  SARS CORONAVIRUS 2 (TAT 6-24 HRS) Nasopharyngeal Nasopharyngeal Swab     Status: None   Collection Time: 08/26/19 10:07 AM   Specimen: Nasopharyngeal Swab  Result Value Ref Range Status   SARS Coronavirus 2 NEGATIVE NEGATIVE Final    Comment: (NOTE) SARS-CoV-2 target nucleic acids are NOT DETECTED. The SARS-CoV-2 RNA is generally detectable in upper and lower respiratory specimens during the acute phase of infection.  Negative results do not preclude SARS-CoV-2 infection, do not rule out co-infections with other pathogens, and should not be used as the sole basis for treatment or other patient management decisions. Negative results must be combined with clinical observations, patient history, and epidemiological information. The expected result is Negative. Fact Sheet for Patients: SugarRoll.be Fact Sheet for Healthcare Providers: https://www.woods-mathews.com/ This test is not yet approved or cleared by the [place] FDA and  has been authorized for detection and/or diagnosis of SARS-CoV-2 by FDA under an Emergency Use Authorization (EUA). This EUA will remain  in effect (meaning this test can be used) for the duration of the COVID-19 declaration under Section 56 4(b)(1) of the Act, 21 U.S.C. section 360bbb-3(b)(1), unless the authorization is terminated or revoked sooner. Performed at [facility], [place] [address], [place], [place] [address]     Discharge Instructions     Discharge Instructions    Diet - low sodium heart healthy   Complete by: As directed    Discharge instructions   Complete by: As directed    You were seen and examined in the hospital for a Vasovagal event leading to loss of consciousness and cared for by a hospitalist.   Upon Discharge:  - continue taking your medications as prescribed - get up slowly. If you begin having similar symptoms, make sure you sit down and rest so you do not fall. If this continues to happen, notify your primary care physician or return to the ED.   Request that your primary physician go over all hospital tests and procedures/radiological results at the follow up.   Please get all hospital records sent to your physician by signing a hospital release before you go home.   Read the complete instructions along with all the possible side effects for all the medicines you take and that have been  prescribed to you. Take any new medicines after you have completely understood and accept all the possible adverse reactions/side effects.   If you have any questions about your discharge medications or the care you received while you were in the hospital, you can call the unit and asked to speak with the hospitalist on call. Once you are discharged, your primary care physician will handle any further medical issues. Please note that NO REFILLS for any discharge medications will be authorized, as it is imperative that you return to your primary care physician (or establish a relationship with a  primary care physician if you do not have one) for your aftercare needs so that they can reassess your need for medications and monitor your lab values.   Do not drive, operate heavy machinery, perform activities at heights, swimming or participation in water activities or provide baby sitting services if your were admitted for loss of consciousness/seizures or if you are on sedating medications including, but not limited to benzodiazepines, sleep medications, narcotic pain medications, etc., until you have been cleared to do so by a medical doctor.   Do not take more than prescribed medications.   Wear a seat belt while driving.  If you have smoked or chewed Tobacco in the last 2 years please stop smoking; also stop any regular Alcohol and/or any Recreational drug use including marijuana.  If you experience worsening of your admission symptoms or develop shortness of breath, chest pain, suicidal or homicidal thoughts or experience a life threatening emergency, you must seek medical attention immediately by calling 911 or calling your PCP immediately.   Increase activity slowly   Complete by: As directed      Allergies as of 08/27/2019   No Known Allergies     Medication List    TAKE these medications   acetaminophen 500 MG tablet Commonly known as: TYLENOL Take 1,000 mg by mouth every 6 (six) hours  as needed.   amLODipine 10 MG tablet Commonly known as: NORVASC Take 10 mg by mouth daily.   atorvastatin 20 MG tablet Commonly known as: LIPITOR Take 20 mg by mouth daily.   venetoclax 100 MG Tabs Take 400 mg by mouth every evening.       No Known Allergies  Time coordinating discharge: Over 30 minutes   SIGNED:   Harold Hedge, D.O. [facility] Pager: [phone]  08/27/2019, 12:58 PM

## 2021-02-16 IMAGING — DX DG CHEST 1V PORT
1 series · 1 of 1 positions shown · non-contrast
Comparison: 02/14/2019

CLINICAL DATA: Syncope

EXAM:
PORTABLE CHEST 1 VIEW

[chest ap]
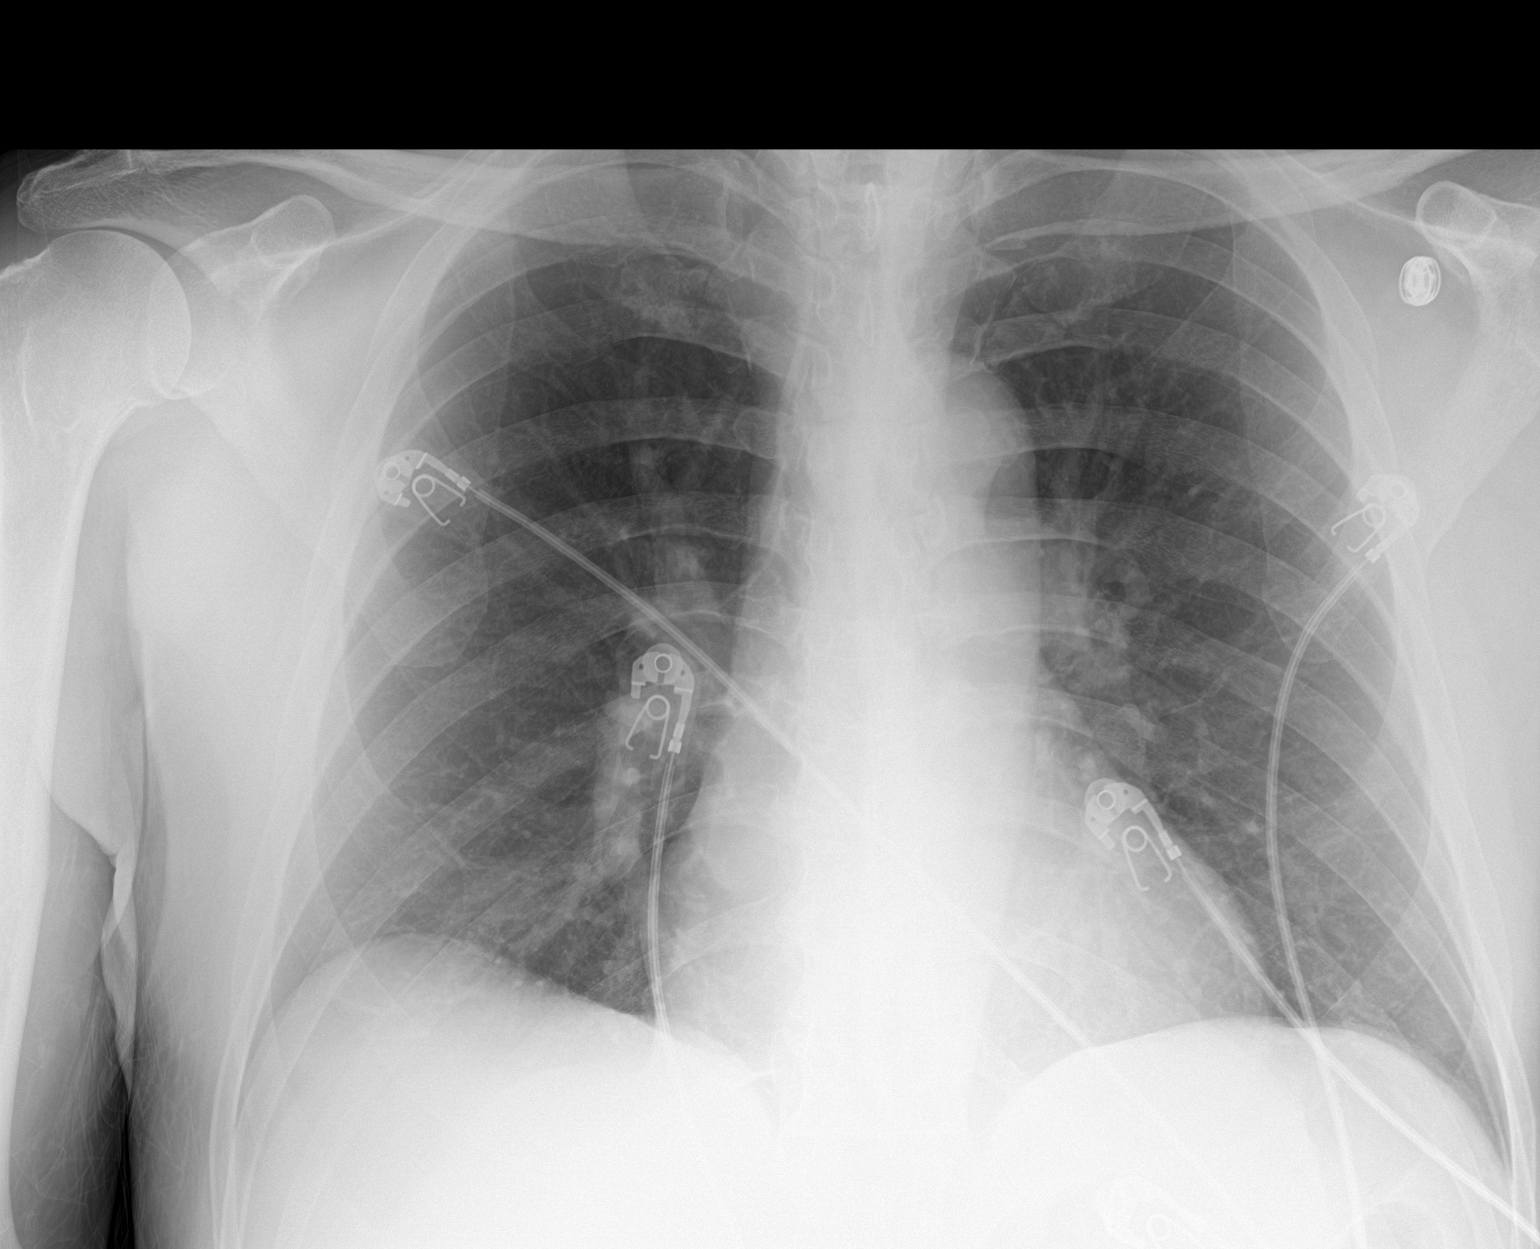

[1 of 1 positions shown; findings below may reference images not displayed]

FINDINGS: The heart size and mediastinal contours are stable. No focal
airspace consolidation, pleural effusion, or pneumothorax. The
visualized skeletal structures are unremarkable.
IMPRESSION: No active disease.
